# Patient Record
Sex: Female | Born: 1950 | Race: White | Hispanic: No | Marital: Married | Smoking: Never smoker
Health system: Southern US, Community
[De-identification: ages and names within clinical notes are randomized; demographics above are authoritative.]

## PROBLEM LIST (undated history)

## (undated) ENCOUNTER — Encounter
Attending: Student in an Organized Health Care Education/Training Program | Primary: Student in an Organized Health Care Education/Training Program

## (undated) ENCOUNTER — Telehealth

## (undated) ENCOUNTER — Encounter

## (undated) ENCOUNTER — Telehealth
Attending: Student in an Organized Health Care Education/Training Program | Primary: Student in an Organized Health Care Education/Training Program

## (undated) ENCOUNTER — Encounter: Attending: Pulmonary Disease | Primary: Pulmonary Disease

## (undated) ENCOUNTER — Ambulatory Visit
Payer: MEDICARE | Attending: Student in an Organized Health Care Education/Training Program | Primary: Student in an Organized Health Care Education/Training Program

## (undated) ENCOUNTER — Ambulatory Visit: Payer: MEDICARE

## (undated) ENCOUNTER — Ambulatory Visit

## (undated) ENCOUNTER — Inpatient Hospital Stay: Payer: PRIVATE HEALTH INSURANCE

## (undated) ENCOUNTER — Telehealth: Attending: Pulmonary Disease | Primary: Pulmonary Disease

## (undated) ENCOUNTER — Ambulatory Visit: Attending: Pharmacist | Primary: Pharmacist

## (undated) ENCOUNTER — Encounter: Attending: Internal Medicine | Primary: Internal Medicine

## (undated) ENCOUNTER — Ambulatory Visit: Payer: PRIVATE HEALTH INSURANCE

## (undated) DIAGNOSIS — I341 Nonrheumatic mitral (valve) prolapse: Secondary | ICD-10-CM

## (undated) DIAGNOSIS — C801 Malignant (primary) neoplasm, unspecified: Secondary | ICD-10-CM

## (undated) DIAGNOSIS — E039 Hypothyroidism, unspecified: Secondary | ICD-10-CM

## (undated) DIAGNOSIS — Z972 Presence of dental prosthetic device (complete) (partial): Secondary | ICD-10-CM

## (undated) DIAGNOSIS — G35 Multiple sclerosis: Secondary | ICD-10-CM

## (undated) DIAGNOSIS — R002 Palpitations: Secondary | ICD-10-CM

## (undated) DIAGNOSIS — G43909 Migraine, unspecified, not intractable, without status migrainosus: Secondary | ICD-10-CM

## (undated) HISTORY — PX: ABDOMINAL HYSTERECTOMY: SHX81

## (undated) HISTORY — PX: ABDOMINAL SURGERY: SHX537

## (undated) MED ORDER — ASCORBIC ACID (VITAMIN C) 250 MG TABLET: Freq: Every day | ORAL | 0.00000 days | Status: SS

## (undated) MED ORDER — CALCIUM 650 MG-VITAMIN D3 12.5 MCG-VITAMIN K 40 MCG CHEWABLE TABLET: Freq: Every day | ORAL | 0.00000 days | Status: SS

## (undated) MED ORDER — CHOLECALCIFEROL (VITAMIN D3) 50 MCG (2,000 UNIT) CAPSULE: Freq: Every day | ORAL | 0.00000 days | Status: SS

## (undated) MED ORDER — FLINTSTONES COMPLETE (IRON) CHEWABLE TABLET: Freq: Every day | ORAL | 0.00000 days | Status: SS

## (undated) MED ORDER — VITAMIN D2 ORAL
Freq: Every day | ORAL | 0.00000 days | Status: SS
Start: ? — End: 2020-02-25

## (undated) MED ORDER — CALCIUM CARBONATE 200 MG CALCIUM (500 MG) CHEWABLE TABLET: Freq: Two times a day (BID) | ORAL | 0.00000 days | Status: SS | PRN

---

## 2004-04-14 ENCOUNTER — Ambulatory Visit: Payer: Self-pay | Admitting: Internal Medicine

## 2004-08-29 ENCOUNTER — Ambulatory Visit: Payer: Self-pay | Admitting: Internal Medicine

## 2004-09-05 ENCOUNTER — Ambulatory Visit: Payer: Self-pay | Admitting: Internal Medicine

## 2004-11-18 ENCOUNTER — Ambulatory Visit: Payer: Self-pay | Admitting: Podiatry

## 2005-04-12 ENCOUNTER — Ambulatory Visit: Payer: Self-pay | Admitting: Internal Medicine

## 2005-04-19 ENCOUNTER — Ambulatory Visit: Payer: Self-pay | Admitting: Internal Medicine

## 2005-10-02 ENCOUNTER — Ambulatory Visit: Payer: Self-pay | Admitting: Internal Medicine

## 2005-11-13 ENCOUNTER — Ambulatory Visit: Payer: Self-pay | Admitting: Internal Medicine

## 2006-10-09 ENCOUNTER — Ambulatory Visit: Payer: Self-pay | Admitting: Internal Medicine

## 2007-10-10 ENCOUNTER — Ambulatory Visit: Payer: Self-pay | Admitting: Internal Medicine

## 2008-10-13 ENCOUNTER — Ambulatory Visit: Payer: Self-pay | Admitting: Internal Medicine

## 2009-10-30 ENCOUNTER — Ambulatory Visit: Payer: Self-pay | Admitting: Internal Medicine

## 2009-11-24 ENCOUNTER — Ambulatory Visit: Payer: Self-pay | Admitting: Internal Medicine

## 2011-01-15 ENCOUNTER — Ambulatory Visit: Payer: Self-pay

## 2011-01-17 ENCOUNTER — Ambulatory Visit: Payer: Self-pay | Admitting: Internal Medicine

## 2012-02-06 ENCOUNTER — Ambulatory Visit: Payer: Self-pay | Admitting: Nurse Practitioner

## 2013-02-11 ENCOUNTER — Ambulatory Visit: Payer: Self-pay | Admitting: Nurse Practitioner

## 2014-03-03 ENCOUNTER — Ambulatory Visit: Payer: Self-pay | Admitting: Nurse Practitioner

## 2014-12-08 ENCOUNTER — Other Ambulatory Visit: Payer: Self-pay | Admitting: Nurse Practitioner

## 2014-12-08 DIAGNOSIS — Z1231 Encounter for screening mammogram for malignant neoplasm of breast: Secondary | ICD-10-CM

## 2015-03-09 ENCOUNTER — Ambulatory Visit
Admission: RE | Admit: 2015-03-09 | Discharge: 2015-03-09 | Disposition: A | Discharge status: Home / Self Care | Payer: BLUE CROSS/BLUE SHIELD | Source: Ambulatory Visit | Attending: Nurse Practitioner | Admitting: Nurse Practitioner

## 2015-03-09 DIAGNOSIS — Z1231 Encounter for screening mammogram for malignant neoplasm of breast: Secondary | ICD-10-CM

## 2015-09-07 ENCOUNTER — Encounter: Payer: Self-pay | Admitting: Emergency Medicine

## 2015-09-07 ENCOUNTER — Emergency Department: Payer: BLUE CROSS/BLUE SHIELD

## 2015-09-07 ENCOUNTER — Emergency Department
Admission: EM | Admit: 2015-09-07 | Discharge: 2015-09-07 | Disposition: A | Discharge status: Home / Self Care | Payer: BLUE CROSS/BLUE SHIELD | Attending: Student | Admitting: Student

## 2015-09-07 DIAGNOSIS — R200 Anesthesia of skin: Secondary | ICD-10-CM | POA: Diagnosis not present

## 2015-09-07 DIAGNOSIS — Z79899 Other long term (current) drug therapy: Secondary | ICD-10-CM | POA: Diagnosis not present

## 2015-09-07 DIAGNOSIS — R51 Headache: Secondary | ICD-10-CM | POA: Insufficient documentation

## 2015-09-07 DIAGNOSIS — R202 Paresthesia of skin: Secondary | ICD-10-CM

## 2015-09-07 DIAGNOSIS — R519 Headache, unspecified: Secondary | ICD-10-CM

## 2015-09-07 HISTORY — DX: Multiple sclerosis: G35

## 2015-09-07 LAB — CBC
HCT: 39 % (ref 35.0–47.0)
Hemoglobin: 13.2 g/dL (ref 12.0–16.0)
MCH: 28.2 pg (ref 26.0–34.0)
MCHC: 33.9 g/dL (ref 32.0–36.0)
MCV: 83.1 fL (ref 80.0–100.0)
PLATELETS: 202 10*3/uL (ref 150–440)
RBC: 4.7 MIL/uL (ref 3.80–5.20)
RDW: 14 % (ref 11.5–14.5)
WBC: 6 10*3/uL (ref 3.6–11.0)

## 2015-09-07 LAB — URINALYSIS COMPLETE WITH MICROSCOPIC (ARMC ONLY)
BILIRUBIN URINE: NEGATIVE
Glucose, UA: NEGATIVE mg/dL
Hgb urine dipstick: NEGATIVE
KETONES UR: NEGATIVE mg/dL
LEUKOCYTES UA: NEGATIVE
Nitrite: NEGATIVE
PH: 7 (ref 5.0–8.0)
Protein, ur: NEGATIVE mg/dL
SPECIFIC GRAVITY, URINE: 1.004 — AB (ref 1.005–1.030)

## 2015-09-07 LAB — BASIC METABOLIC PANEL
Anion gap: 6 (ref 5–15)
BUN: 15 mg/dL (ref 6–20)
CALCIUM: 9.8 mg/dL (ref 8.9–10.3)
CHLORIDE: 100 mmol/L — AB (ref 101–111)
CO2: 29 mmol/L (ref 22–32)
CREATININE: 0.96 mg/dL (ref 0.44–1.00)
GFR calc non Af Amer: >60 mL/min (ref >60)
Glucose, Bld: 100 mg/dL — ABNORMAL HIGH (ref 65–99)
Potassium: 4 mmol/L (ref 3.5–5.1)
SODIUM: 135 mmol/L (ref 135–145)

## 2015-09-07 MED ORDER — LORAZEPAM 2 MG/ML IJ SOLN
0.5000 mg | Freq: Once | INTRAMUSCULAR | Status: AC
  Administered 2015-09-07: 0.5 mg via INTRAVENOUS
  Filled 2015-09-07: qty 1

## 2015-09-07 MED ORDER — GADOBENATE DIMEGLUMINE 529 MG/ML IV SOLN
10.0000 mL | Freq: Once | INTRAVENOUS | Status: AC | PRN
  Administered 2015-09-07: 10 mL via INTRAVENOUS

## 2015-09-07 NOTE — ED Notes (Signed)
Pt returned from MRI via stretcher in care of Judson Roch, EDT.   Pt ambulatory to toilet.

## 2015-09-07 NOTE — ED Notes (Signed)
Dr. Gayle at bedside  

## 2015-09-07 NOTE — ED Provider Notes (Signed)
[facility] Emergency Department Provider Note   ____________________________________________  Time seen: Approximately 6:15 PM  I have reviewed the triage vital signs and the nursing notes.   HISTORY  Chief Complaint Numbness    HPI Shannon Gilmore is a 65 y.o. female with history of hypothyroidism, headaches, multiple sclerosis who presents for evaluation of numbness in the left face, arm, leg today constant since 2 PM, she describes as moderate, no modifying factors. Patient reports that she has also had contraction-like pain in the right side of her head on and off for the past week. Currently she is not having any headache. She reports that she has not had numbness this severe or quickly progressing in the setting of MS previously though she has had some intermittent left-sided numbness before. No recent illness including no vomiting, diarrhea, fevers or chills. No chest or difficulty breathing. No associated weakness, vision change or word-finding difficulty.   Past Medical History  Diagnosis Date  . MS (multiple sclerosis) ([place])     There are no active problems to display for this patient.   Past Surgical History  Procedure Laterality Date  . Abdominal surgery    . Abdominal hysterectomy      Current Outpatient Rx  Name  Route  Sig  Dispense  Refill  . amitriptyline (ELAVIL) 10 MG tablet   Oral   Take 10 mg by mouth at bedtime.         . conjugated estrogens (PREMARIN) vaginal cream   Vaginal   Place 1 Applicatorful vaginally daily.         Marland Kitchen dexamethasone 0.5 MG/5ML elixir   Oral   Take 5 mLs by mouth daily.         Marland Kitchen levothyroxine (SYNTHROID, LEVOTHROID) 50 MCG tablet   Oral   Take 50 mcg by mouth daily before breakfast.         . propranolol ER (INDERAL LA) 80 MG 24 hr capsule   Oral   Take 80 mg by mouth daily.           Allergies Sulfa antibiotics  No family history on file.  Social History Social  History  Substance Use Topics  . Smoking status: Never Smoker   . Smokeless tobacco: None  . Alcohol Use: No    Review of Systems Constitutional: No fever/chills Eyes: No visual changes. ENT: No sore throat. Cardiovascular: Denies chest pain. Respiratory: Denies shortness of breath. Gastrointestinal: No abdominal pain.  No nausea, no vomiting.  No diarrhea.  No constipation. Genitourinary: Negative for dysuria. Musculoskeletal: Negative for back pain. Skin: Negative for rash. Neurological: Positive for headache, no focal weakness, + numbness.  10-point ROS otherwise negative.  ____________________________________________   PHYSICAL EXAM:  VITAL SIGNS: ED Triage Vitals  Enc Vitals Group     BP 09/07/15 1635 147/89 mmHg     Pulse Rate 09/07/15 1635 57     Resp 09/07/15 1635 18     Temp 09/07/15 1635 97.7 F (36.5 C)     Temp Source 09/07/15 1635 Oral     SpO2 09/07/15 1635 97 %     Weight 09/07/15 1635 126 lb (57.153 kg)     Height 09/07/15 1635 5\' 1"  (1.549 m)     Head Cir --      Peak Flow --      Pain Score --      Pain Loc --      Pain Edu? --  Excl. in [place]? --     Constitutional: Alert and oriented. Well appearing and in no acute distress. Eyes: Conjunctivae are normal. PERRL. EOMI. Head: Atraumatic. Nose: No congestion/rhinnorhea. Mouth/Throat: Mucous membranes are moist.  Oropharynx non-erythematous. Neck: No stridor.  Supple without meningismus. Cardiovascular: Normal rate, regular rhythm. Grossly normal heart sounds.  Good peripheral circulation. Respiratory: Normal respiratory effort.  No retractions. Lungs CTAB. Gastrointestinal: Soft and nontender. No distention. No CVA tenderness. Genitourinary: deferred Musculoskeletal: No lower extremity tenderness nor edema.  No joint effusions. Neurologic:  Normal speech and language. 5/5 strength in bilateral upper and lower extremities, mildly decreased sensation to light touch throughout the entire left  face, left arm, left leg, sensation intact to light touch in the right face arm and leg. Cranial nerves II through XII otherwise intact. Skin:  Skin is warm, dry and intact. No rash noted. Psychiatric: Mood and affect are normal. Speech and behavior are normal.  ____________________________________________   LABS (all labs ordered are listed, but only abnormal results are displayed)  Labs Reviewed  BASIC METABOLIC PANEL - Abnormal; Notable for the following:    Chloride 100 (*)    Glucose, Bld 100 (*)    All other components within normal limits  URINALYSIS COMPLETEWITH MICROSCOPIC ([facility] ONLY) - Abnormal; Notable for the following:    Color, Urine STRAW (*)    APPearance CLEAR (*)    Specific Gravity, Urine 1.004 (*)    Bacteria, UA RARE (*)    Squamous Epithelial / LPF 0-5 (*)    All other components within normal limits  CBC   ____________________________________________  EKG  ED ECG REPORT I, Joanne Gavel, the attending physician, personally viewed and interpreted this ECG.   Date: 09/07/2015  EKG Time: 16:49  Rate: 56  Rhythm: sinus bradycardia  Axis: normal  Intervals:none  ST&T Change: No acute ST elevation or acute ST depression.  ____________________________________________  RADIOLOGY  MRI Brain with and without Contrast IMPRESSION: 1. Unchanged distribution of scattered white matter lesions in a pattern that is compatible with the diagnosis of multiple sclerosis. 2. No new or active demyelinating lesions. 3. No acute intracranial abnormality ____________________________________________   PROCEDURES  Procedure(s) performed: None  Procedures  Critical Care performed: No  ____________________________________________   INITIAL IMPRESSION / ASSESSMENT AND PLAN / ED COURSE  Pertinent labs & imaging results that were available during my care of the patient were reviewed by me and considered in my medical decision making (see chart for  details).  Shannon Gilmore is a 65 y.o. female with history of hypothyroidism, headaches, multiple sclerosis who presents for evaluation of numbness in the left face, arm, leg today constant since 2 PM, she describes as moderate, no modifying factors. On exam, she is very well-appearing and in no acute distress her vital signs are stable, she is afebrile. She has the faintest decreased sensation to light touch throughout the left face arm and leg without any other associated neurological deficit. EKG is reassuring. CBC, BMP unremarkable. Urinalysis is not consistent with infection. Her deficits are mild however she reports that she's not had symptoms this severe previously with multiple sclerosis so we will obtain an MRI of the brain with and without contrast to evaluate for active white matter lesions. She is also complaining of this contraction-like pain in the right side of her scalp and I doubt that this represents subarachnoid hemorrhage, not consistent with meningitis.  ----------------------------------------- 9:51 PM on 09/07/2015 ----------------------------------------- Patient reports that her headache has improved at  this time without any intervention in the emergency department. The MRI of her brain is unchanged from prior, there is unchanged distribution of white matter lesions but no new or active demyelinating lesions, no other acute intracranial abnormality. I discussed this with her and she is relieved. I discussed that the exact cause of her paresthesia is not clear to me and she needs to follow with her primary care doctor within the next 1-2 days for reevaluation. Nonspecific headache has also resolved. We discussed return precautions, need for close PCP follow-up and neurology follow-up and she is comfortable with the discharge plan.  ____________________________________________   FINAL CLINICAL IMPRESSION(S) / ED DIAGNOSES  Final diagnoses:  Numbness  Paresthesia  Acute  nonintractable headache, unspecified headache type      NEW MEDICATIONS STARTED DURING THIS VISIT:  New Prescriptions   No medications on file     Note:  This document was prepared using Dragon voice recognition software and may include unintentional dictation errors.    Joanne Gavel, MD 09/07/15 2152

## 2015-09-07 NOTE — ED Notes (Addendum)
Pt states she was diagnosed with a "slowly progressing" form of MS in the late 80s and has never followed up on this diagnosis with a neurologist d/t her symptoms always being consistent and non worrisome to her, always "spotty numbness" on the left side of her head and body that is tolerated well.  Last week she states a new sensation started, described as a "drawing sensation", also equates it with "Braxton Hicks contractions" in the right side of her head.  In addition the numbness on the left side has been more consistent than in the past.

## 2015-09-07 NOTE — ED Notes (Signed)
Pt transported to MRI via stretcher.  

## 2015-09-07 NOTE — ED Notes (Signed)
Stallings, MRI tech, called and stated IV was not working. Starting another one to get MRI done.

## 2015-09-07 NOTE — ED Notes (Signed)
Pt ambulatory to toilet with no assistance.  

## 2015-09-07 NOTE — ED Notes (Signed)
Pt provided ginger ale to drink, ok per Dr. Edd Fabian.

## 2015-09-07 NOTE — ED Notes (Signed)
Pt with numbing sensation to top of right side scalp that ran all the way down the right side of her body. Pt with hx of MS. Denies  Any pain at this time.

## 2015-12-27 ENCOUNTER — Other Ambulatory Visit: Payer: Self-pay | Admitting: Nurse Practitioner

## 2015-12-27 DIAGNOSIS — Z1231 Encounter for screening mammogram for malignant neoplasm of breast: Secondary | ICD-10-CM

## 2016-03-14 ENCOUNTER — Ambulatory Visit
Admission: RE | Admit: 2016-03-14 | Discharge: 2016-03-14 | Disposition: A | Discharge status: Home / Self Care | Payer: BLUE CROSS/BLUE SHIELD | Source: Ambulatory Visit | Attending: Nurse Practitioner | Admitting: Nurse Practitioner

## 2016-03-14 DIAGNOSIS — Z1231 Encounter for screening mammogram for malignant neoplasm of breast: Secondary | ICD-10-CM | POA: Diagnosis present

## 2016-03-14 HISTORY — DX: Malignant (primary) neoplasm, unspecified: C80.1

## 2017-01-02 ENCOUNTER — Other Ambulatory Visit: Payer: Self-pay | Admitting: Nurse Practitioner

## 2017-01-02 DIAGNOSIS — Z1231 Encounter for screening mammogram for malignant neoplasm of breast: Secondary | ICD-10-CM

## 2017-03-20 ENCOUNTER — Ambulatory Visit
Admission: RE | Admit: 2017-03-20 | Discharge: 2017-03-20 | Disposition: A | Discharge status: Home / Self Care | Payer: Medicare Other | Source: Ambulatory Visit | Attending: Nurse Practitioner | Admitting: Nurse Practitioner

## 2017-03-20 ENCOUNTER — Inpatient Hospital Stay: Admission: RE | Admit: 2017-03-20 | Payer: BLUE CROSS/BLUE SHIELD | Source: Ambulatory Visit

## 2017-03-20 DIAGNOSIS — Z1231 Encounter for screening mammogram for malignant neoplasm of breast: Secondary | ICD-10-CM | POA: Insufficient documentation

## 2017-07-04 ENCOUNTER — Other Ambulatory Visit: Payer: Self-pay | Admitting: Nurse Practitioner

## 2017-07-04 DIAGNOSIS — R1310 Dysphagia, unspecified: Secondary | ICD-10-CM

## 2017-07-04 DIAGNOSIS — G35 Multiple sclerosis: Secondary | ICD-10-CM

## 2017-07-17 ENCOUNTER — Ambulatory Visit: Payer: Medicare Other

## 2017-07-19 ENCOUNTER — Other Ambulatory Visit: Payer: Self-pay | Admitting: Nurse Practitioner

## 2017-07-19 DIAGNOSIS — R1310 Dysphagia, unspecified: Secondary | ICD-10-CM

## 2017-07-19 DIAGNOSIS — G35 Multiple sclerosis: Secondary | ICD-10-CM

## 2017-07-24 ENCOUNTER — Ambulatory Visit
Admission: RE | Admit: 2017-07-24 | Discharge: 2017-07-24 | Disposition: A | Discharge status: Home / Self Care | Payer: Medicare Other | Source: Ambulatory Visit | Attending: Nurse Practitioner | Admitting: Nurse Practitioner

## 2017-07-24 DIAGNOSIS — Q387 Congenital pharyngeal pouch: Secondary | ICD-10-CM | POA: Insufficient documentation

## 2017-07-24 DIAGNOSIS — R1310 Dysphagia, unspecified: Secondary | ICD-10-CM | POA: Diagnosis present

## 2017-07-24 DIAGNOSIS — G35 Multiple sclerosis: Secondary | ICD-10-CM | POA: Insufficient documentation

## 2017-11-08 ENCOUNTER — Other Ambulatory Visit: Payer: Self-pay | Admitting: Podiatry

## 2017-11-08 ENCOUNTER — Encounter: Payer: Self-pay | Admitting: *Deleted

## 2017-11-08 ENCOUNTER — Other Ambulatory Visit: Payer: Self-pay

## 2017-11-08 NOTE — Discharge Instructions (Signed)
[facility] °[facility] ° °POST OPERATIVE INSTRUCTIONS FOR DR. TROXLER AND DR. FOWLER °[facility] ° ° °1. Take your medication as prescribed.  Pain medication should be taken only as needed. ° °2. Keep the dressing clean, dry and intact. ° °3. Keep your foot elevated above the heart level for the first 48 hours. ° °4. Walking to the bathroom and brief periods of walking are acceptable, unless we have instructed you to be non-weight bearing. ° °5. Always wear your post-op shoe when walking.  Always use your crutches if you are to be non-weight bearing. ° °6. Do not take a shower. Baths are permissible as long as the foot is kept out of the water.  ° °7. Every hour you are awake:  °- Bend your knee 15 times. °- Flex foot 15 times °- Massage calf 15 times ° °8. Call [facility] ([phone]) if any of the following problems occur: °- You develop a temperature or fever. °- The bandage becomes saturated with blood. °- Medication does not stop your pain. °- Injury of the foot occurs. °- Any symptoms of infection including redness, odor, or red streaks running from wound. ° ° °General Anesthesia, Adult, Care After °These instructions provide you with information about caring for yourself after your procedure. Your health care provider may also give you more specific instructions. Your treatment has been planned according to current medical practices, but problems sometimes occur. Call your health care provider if you have any problems or questions after your procedure. °What can I expect after the procedure? °After the procedure, it is common to have: °· Vomiting. °· A sore throat. °· Mental slowness. ° °It is common to feel: °· Nauseous. °· Cold or shivery. °· Sleepy. °· Tired. °· Sore or achy, even in parts of your body where you did not have surgery. ° °Follow these instructions at home: °For at least 24 hours after the procedure: °· Do not: °? Participate in  activities where you could fall or become injured. °? Drive. °? Use heavy machinery. °? Drink alcohol. °? Take sleeping pills or medicines that cause drowsiness. °? Make important decisions or sign legal documents. °? Take care of children on your own. °· Rest. °Eating and drinking °· If you vomit, drink water, juice, or soup when you can drink without vomiting. °· Drink enough fluid to keep your urine clear or pale yellow. °· Make sure you have little or no nausea before eating solid foods. °· Follow the diet recommended by your health care provider. °General instructions °· Have a responsible adult stay with you until you are awake and alert. °· Return to your normal activities as told by your health care provider. Ask your health care provider what activities are safe for you. °· Take over-the-counter and prescription medicines only as told by your health care provider. °· If you smoke, do not smoke without supervision. °· Keep all follow-up visits as told by your health care provider. This is important. °Contact a health care provider if: °· You continue to have nausea or vomiting at home, and medicines are not helpful. °· You cannot drink fluids or start eating again. °· You cannot urinate after 8-12 hours. °· You develop a skin rash. °· You have fever. °· You have increasing redness at the site of your procedure. °Get help right away if: °· You have difficulty breathing. °· You have chest pain. °· You have unexpected bleeding. °· You feel that you   are having a life-threatening or urgent problem. °This information is not intended to replace advice given to you by your health care provider. Make sure you discuss any questions you have with your health care provider. °Document Released: 05/15/2000 Document Revised: 07/12/2015 Document Reviewed: 01/21/2015 °Elsevier Interactive Patient Education © 2018 [employer] ° °

## 2017-11-15 ENCOUNTER — Ambulatory Visit
Admission: RE | Admit: 2017-11-15 | Discharge: 2017-11-15 | Disposition: A | Discharge status: Home / Self Care | Payer: Medicare Other | Source: Ambulatory Visit | Attending: Podiatry | Admitting: Podiatry

## 2017-11-15 ENCOUNTER — Ambulatory Visit: Payer: Medicare Other | Admitting: Anesthesiology

## 2017-11-15 ENCOUNTER — Encounter
Admission: RE | Disposition: A | Discharge status: Home / Self Care | Payer: Self-pay | Source: Ambulatory Visit | Attending: Podiatry

## 2017-11-15 DIAGNOSIS — Z7952 Long term (current) use of systemic steroids: Secondary | ICD-10-CM | POA: Diagnosis not present

## 2017-11-15 DIAGNOSIS — E039 Hypothyroidism, unspecified: Secondary | ICD-10-CM | POA: Insufficient documentation

## 2017-11-15 DIAGNOSIS — M2012 Hallux valgus (acquired), left foot: Secondary | ICD-10-CM | POA: Insufficient documentation

## 2017-11-15 DIAGNOSIS — G35 Multiple sclerosis: Secondary | ICD-10-CM | POA: Diagnosis not present

## 2017-11-15 DIAGNOSIS — Z79890 Hormone replacement therapy (postmenopausal): Secondary | ICD-10-CM | POA: Diagnosis not present

## 2017-11-15 DIAGNOSIS — Z7982 Long term (current) use of aspirin: Secondary | ICD-10-CM | POA: Diagnosis not present

## 2017-11-15 DIAGNOSIS — Z79899 Other long term (current) drug therapy: Secondary | ICD-10-CM | POA: Diagnosis not present

## 2017-11-15 HISTORY — DX: Hypothyroidism, unspecified: E03.9

## 2017-11-15 HISTORY — DX: Palpitations: R00.2

## 2017-11-15 HISTORY — PX: HALLUX VALGUS AUSTIN: SHX6623

## 2017-11-15 HISTORY — DX: Migraine, unspecified, not intractable, without status migrainosus: G43.909

## 2017-11-15 HISTORY — PX: AIKEN OSTEOTOMY: SHX6331

## 2017-11-15 HISTORY — DX: Presence of dental prosthetic device (complete) (partial): Z97.2

## 2017-11-15 HISTORY — DX: Nonrheumatic mitral (valve) prolapse: I34.1

## 2017-11-15 SURGERY — BUNIONECTOMY, AKIN
Anesthesia: General | Site: Foot | Laterality: Left

## 2017-11-15 MED ORDER — MIDAZOLAM HCL 5 MG/5ML IJ SOLN
INTRAMUSCULAR | Status: DC | PRN
  Administered 2017-11-15: 2 mg via INTRAVENOUS

## 2017-11-15 MED ORDER — BUPIVACAINE LIPOSOME 1.3 % IJ SUSP
INTRAMUSCULAR | Status: DC | PRN
  Administered 2017-11-15: 3.5 mL
  Administered 2017-11-15: 5 mL

## 2017-11-15 MED ORDER — FENTANYL CITRATE (PF) 100 MCG/2ML IJ SOLN
INTRAMUSCULAR | Status: DC | PRN
  Administered 2017-11-15: 25 ug via INTRAVENOUS

## 2017-11-15 MED ORDER — EPHEDRINE SULFATE 50 MG/ML IJ SOLN
INTRAMUSCULAR | Status: DC | PRN
  Administered 2017-11-15: 5 mg via INTRAVENOUS
  Administered 2017-11-15 (×4): 10 mg via INTRAVENOUS

## 2017-11-15 MED ORDER — FENTANYL CITRATE (PF) 100 MCG/2ML IJ SOLN: 25.0000 ug | INTRAMUSCULAR | Status: DC | PRN

## 2017-11-15 MED ORDER — PROMETHAZINE HCL 25 MG/ML IJ SOLN: 6.2500 mg | INTRAMUSCULAR | Status: DC | PRN

## 2017-11-15 MED ORDER — PHENYLEPHRINE HCL 10 MG/ML IJ SOLN
INTRAMUSCULAR | Status: DC | PRN
  Administered 2017-11-15 (×2): 50 ug via INTRAVENOUS
  Administered 2017-11-15: 100 ug via INTRAVENOUS

## 2017-11-15 MED ORDER — GLYCOPYRROLATE 0.2 MG/ML IJ SOLN
INTRAMUSCULAR | Status: DC | PRN
  Administered 2017-11-15 (×2): 0.1 mg via INTRAVENOUS

## 2017-11-15 MED ORDER — OXYCODONE HCL 5 MG/5ML PO SOLN: 5.0000 mg | Freq: Once | ORAL | Status: DC | PRN

## 2017-11-15 MED ORDER — OXYCODONE HCL 5 MG PO TABS: 5.0000 mg | ORAL_TABLET | Freq: Once | ORAL | Status: DC | PRN

## 2017-11-15 MED ORDER — ONDANSETRON HCL 4 MG/2ML IJ SOLN
INTRAMUSCULAR | Status: DC | PRN
  Administered 2017-11-15: 4 mg via INTRAVENOUS

## 2017-11-15 MED ORDER — CEFAZOLIN SODIUM-DEXTROSE 2-4 GM/100ML-% IV SOLN
2.0000 g | INTRAVENOUS | Status: AC
  Administered 2017-11-15: 2 g via INTRAVENOUS

## 2017-11-15 MED ORDER — BUPIVACAINE HCL 0.25 % IJ SOLN
INTRAMUSCULAR | Status: DC | PRN
  Administered 2017-11-15: 3.5 mL
  Administered 2017-11-15: 5 mL

## 2017-11-15 MED ORDER — POVIDONE-IODINE 7.5 % EX SOLN: Freq: Once | CUTANEOUS | Status: DC

## 2017-11-15 MED ORDER — LACTATED RINGERS IV SOLN
INTRAVENOUS | Status: DC | PRN
  Administered 2017-11-15 (×2): via INTRAVENOUS

## 2017-11-15 MED ORDER — PROPOFOL 10 MG/ML IV BOLUS
INTRAVENOUS | Status: DC | PRN
  Administered 2017-11-15: 50 mg via INTRAVENOUS
  Administered 2017-11-15: 150 mg via INTRAVENOUS

## 2017-11-15 MED ORDER — OXYCODONE-ACETAMINOPHEN 7.5-325 MG PO TABS: 1.0000 | ORAL_TABLET | ORAL | 0 refills | Status: AC | PRN

## 2017-11-15 MED ORDER — LIDOCAINE HCL (CARDIAC) PF 100 MG/5ML IV SOSY
PREFILLED_SYRINGE | INTRAVENOUS | Status: DC | PRN
  Administered 2017-11-15: 30 mg via INTRATRACHEAL

## 2017-11-15 MED ORDER — DEXAMETHASONE SODIUM PHOSPHATE 4 MG/ML IJ SOLN
INTRAMUSCULAR | Status: DC | PRN
  Administered 2017-11-15: 4 mg via INTRAVENOUS

## 2017-11-15 SURGICAL SUPPLY — 43 items
2.5 countersink ×3 IMPLANT
BANDAGE ELASTIC 4 LF NS (GAUZE/BANDAGES/DRESSINGS) ×3 IMPLANT
BENZOIN TINCTURE PRP APPL 2/3 (GAUZE/BANDAGES/DRESSINGS) ×3 IMPLANT
BIT DRILL 1.7 LNG CANN (DRILL) ×3 IMPLANT
BLADE OSC/SAGITTAL MD 5.5X18 (BLADE) ×3 IMPLANT
BLADE OSC/SAGITTAL MD 9X18.5 (BLADE) ×3 IMPLANT
BNDG ESMARK 4X12 TAN STRL LF (GAUZE/BANDAGES/DRESSINGS) ×3 IMPLANT
BNDG GAUZE 4.5X4.1 6PLY STRL (MISCELLANEOUS) ×3 IMPLANT
BNDG STRETCH 4X75 STRL LF (GAUZE/BANDAGES/DRESSINGS) ×3 IMPLANT
CANISTER SUCT 1200ML W/VALVE (MISCELLANEOUS) ×3 IMPLANT
CLOSURE WOUND 1/4X4 (GAUZE/BANDAGES/DRESSINGS) ×1
COVER LIGHT HANDLE UNIVERSAL (MISCELLANEOUS) ×6 IMPLANT
CUFF TOURN SGL QUICK 18 (TOURNIQUET CUFF) ×3 IMPLANT
DRAPE FLUOR MINI C-ARM 54X84 (DRAPES) ×3 IMPLANT
DURAPREP 26ML APPLICATOR (WOUND CARE) ×3 IMPLANT
ELECT REM PT RETURN 9FT ADLT (ELECTROSURGICAL) ×3
ELECTRODE REM PT RTRN 9FT ADLT (ELECTROSURGICAL) ×1 IMPLANT
GAUZE PETRO XEROFOAM 1X8 (MISCELLANEOUS) ×3 IMPLANT
GAUZE SPONGE 4X4 12PLY STRL (GAUZE/BANDAGES/DRESSINGS) ×3 IMPLANT
GLOVE BIO SURGEON STRL SZ8 (GLOVE) ×3 IMPLANT
GOWN STRL REUS W/ TWL LRG LVL3 (GOWN DISPOSABLE) ×1 IMPLANT
GOWN STRL REUS W/ TWL XL LVL3 (GOWN DISPOSABLE) ×1 IMPLANT
GOWN STRL REUS W/TWL LRG LVL3 (GOWN DISPOSABLE) ×2
GOWN STRL REUS W/TWL XL LVL3 (GOWN DISPOSABLE) ×2
K-WIRE DBL END TROCAR 6X.045 (WIRE) ×3
KIT TURNOVER KIT A (KITS) ×3 IMPLANT
KWIRE DBL END TROCAR 6X.045 (WIRE) ×1 IMPLANT
NEEDLE HYPO 18GX1.5 BLUNT FILL (NEEDLE) ×3 IMPLANT
NEEDLE HYPO 25GX1X1/2 BEV (NEEDLE) ×3 IMPLANT
NS IRRIG 500ML POUR BTL (IV SOLUTION) ×3 IMPLANT
PACK EXTREMITY ARMC (MISCELLANEOUS) ×3 IMPLANT
PENCIL SMOKE EVACUATOR (MISCELLANEOUS) ×3 IMPLANT
RASP SM TEAR CROSS CUT (RASP) ×3 IMPLANT
SCREW HEADLESS 2.5X28 ×3 IMPLANT
STAPLE BONE 9X9X9 (Staple) ×3 IMPLANT
STOCKINETTE STRL 6IN 960660 (GAUZE/BANDAGES/DRESSINGS) ×3 IMPLANT
STRAP BODY AND KNEE 60X3 (MISCELLANEOUS) ×3 IMPLANT
STRIP CLOSURE SKIN 1/4X4 (GAUZE/BANDAGES/DRESSINGS) ×2 IMPLANT
SUT VIC AB 3-0 SH 27 (SUTURE) ×2
SUT VIC AB 3-0 SH 27X BRD (SUTURE) ×1 IMPLANT
SUT VIC AB 4-0 FS2 27 (SUTURE) ×3 IMPLANT
SYR 10ML LL (SYRINGE) IMPLANT
WIRE SMOOTH TROCAR .9MMX150MML (WIRE) ×6 IMPLANT

## 2017-11-15 NOTE — Anesthesia Procedure Notes (Signed)
Procedure Name: LMA Insertion Date/Time: 11/15/2017 9:18 AM Performed by: Cameron Ali, CRNA Pre-anesthesia Checklist: Patient identified, Emergency Drugs available, Suction available, Timeout performed and Patient being monitored Patient Re-evaluated:Patient Re-evaluated prior to induction Oxygen Delivery Method: Circle system utilized Preoxygenation: Pre-oxygenation with 100% oxygen Induction Type: IV induction LMA: LMA inserted LMA Size: 4.0 Number of attempts: 1 Placement Confirmation: positive ETCO2 and breath sounds checked- equal and bilateral Tube secured with: Tape Dental Injury: Teeth and Oropharynx as per pre-operative assessment

## 2017-11-15 NOTE — Op Note (Signed)
Operative note   Surgeon: Dr. Albertine Patricia, DPM.    Assistant: None    Preop diagnosis: Hallux abductovalgus left foot    Postop diagnosis: Same    Procedure:   1.  Hallux abductovalgus correction with double osteotomy including an Liane Comber type procedure with K wire fixation and a proximal phalanx osteotomy with staple fixation           EBL: 5 cc    Anesthesia:general delivered by anesthesia team.  I injected preoperatively 10 cc of Exparel mixed with 0.25% Marcaine plain 50-50 around the operative site.  Postoperatively I injected 6 cc of the same preparation.    Hemostasis: Ankle tourniquet 225 mils marked pressure for 58 minutes    Specimen: None    Complications: None    Operative indications: Chronic pain unresponsive to conservative care    Procedure:  Patient was brought into the OR and placed on the operating table in thesupine position. After anesthesia was obtained theleft lower extremity was prepped and draped in usual sterile fashion.  Operative Report: This time to direct to the first metatarsophalangeal joint of the left foot.  A 5 semilunar incision was made over this joint dorsally and deepened sharp blunt dissection.  Bleeders were clamped bovied as required.  Tissue was identified incised longitudinally and freed away from the dorsal medial plantar aspect first metatarsal head.  Large eminence of bone was noted on the region is resected and rasped smoothly.  There was an area of articular cartilage defect in the plantar central portion of the metatarsal head with a flap of loose cartilage I remove that small flap and then micro-fractured the subchondral bone with a tip of a K wire.  This point a 0.5 care was used for an apical axis guide to make an osteotomy From medial lateral and the metatarsal head neck region.  Once this cut was made care was removed and attention was then directed lateral aspect joint abductor tendon release fibular sesamoidal ligament release  were performed.  The head of metatarsals and transposed more lateral position fixated with a wire from the 2.5 screw set of the mini monster headless screws.  There is checked for single position and correction were noted.  At this point a 28 mm screw was placed across the osteotomy and was seen to take down to fixate the area nicely.  There is checked FluoroScan good position alignment to the first metatarsal head was noted.  The remaining medial shelf then resected and rasped smoothly.  K wire was removed and the screw was left intact.  This time to stick directed to the proximal phalanx which had earlier been dissected with friends periosteal tissue medial lateral and dorsally.  A wedge osteotomy was then performed with apex lateral base medial.  Wedge of bone was removed the area was feathered and the osteotomy was stabilized with a bone staple.  There is checked for single position correction fixation were noted to both there is good alignment of the joint was noted.  At this point there was copiously irrigated and a medial capsulorrhaphy was performed and closed with 3-0 Vicryl simple interrupted sutures.  Capsule tissue was closed with 3-0 Vicryl simple rib sutures and a 4 oh continuous stitch.  Deep superficial fascial layers were closed with a continuous 4-0 Vicryl stitch.  Skin was closed with 4-0 Vicryl subcuticular stitch.  A sterile compressive dressing was placed across wound this time consisting of Steri-Strips Xeroform gauze 4 x 4's Kling and Kerlix.  Tourniquet was released prior to complete vascular seen return to all digits of the left foot.  Posterior splint was placed on the left foot leg in the operating room.    Patient tolerated the procedure and anesthesia well.  Was transported from the OR to the PACU with all vital signs stable and vascular status intact. To be discharged per routine protocol.  Will follow up in approximately 1 week in the outpatient clinic.

## 2017-11-15 NOTE — Anesthesia Postprocedure Evaluation (Signed)
Anesthesia Post Note  Patient: Shannon Gilmore  Procedure(s) Performed: Barbie Banner OSTEOTOMY (Left Foot) HALLUX VALGUS AUSTIN (Left Foot)  Patient location during evaluation: PACU Anesthesia Type: General Level of consciousness: awake and alert Pain management: pain level controlled Vital Signs Assessment: post-procedure vital signs reviewed and stable Respiratory status: spontaneous breathing, nonlabored ventilation, respiratory function stable and patient connected to nasal cannula oxygen Cardiovascular status: blood pressure returned to baseline and stable Postop Assessment: no apparent nausea or vomiting Anesthetic complications: no    Johnda Billiot C

## 2017-11-15 NOTE — Anesthesia Preprocedure Evaluation (Signed)
Anesthesia Evaluation  Patient identified by MRN, date of birth, ID band Patient awake    Reviewed: Allergy & Precautions, NPO status , Patient's Chart, lab work & pertinent test results  Airway Mallampati: II  TM Distance: >3 FB Neck ROM: Full    Dental no notable dental hx.    Pulmonary neg pulmonary ROS,    Pulmonary exam normal breath sounds clear to auscultation       Cardiovascular Normal cardiovascular exam Rhythm:Regular Rate:Normal  MVP with not symptoms. Mets 4+   Neuro/Psych Multiple sclerosis negative psych ROS   GI/Hepatic negative GI ROS, Neg liver ROS,   Endo/Other  Hypothyroidism   Renal/GU negative Renal ROS  negative genitourinary   Musculoskeletal negative musculoskeletal ROS (+)   Abdominal   Peds negative pediatric ROS (+)  Hematology negative hematology ROS (+)   Anesthesia Other Findings   Reproductive/Obstetrics negative OB ROS                             Anesthesia Physical Anesthesia Plan  ASA: II  Anesthesia Plan: General   Post-op Pain Management:    Induction: Intravenous  PONV Risk Score and Plan:   Airway Management Planned: LMA  Additional Equipment:   Intra-op Plan:   Post-operative Plan: Extubation in OR  Informed Consent: I have reviewed the patients History and Physical, chart, labs and discussed the procedure including the risks, benefits and alternatives for the proposed anesthesia with the patient or authorized representative who has indicated his/her understanding and acceptance.   Dental advisory given  Plan Discussed with: CRNA  Anesthesia Plan Comments:         Anesthesia Quick Evaluation

## 2017-11-15 NOTE — H&P (Signed)
H and P has been reviewed and no changes are noted.  

## 2017-11-15 NOTE — Transfer of Care (Signed)
Immediate Anesthesia Transfer of Care Note  Patient: Shannon Gilmore  Procedure(s) Performed: Barbie Banner OSTEOTOMY (Left Foot) HALLUX VALGUS AUSTIN (Left Foot)  Patient Location: PACU  Anesthesia Type: General  Level of Consciousness: awake, alert  and patient cooperative  Airway and Oxygen Therapy: Patient Spontanous Breathing and Patient connected to supplemental oxygen  Post-op Assessment: Post-op Vital signs reviewed, Patient's Cardiovascular Status Stable, Respiratory Function Stable, Patent Airway and No signs of Nausea or vomiting  Post-op Vital Signs: Reviewed and stable  Complications: No apparent anesthesia complications

## 2017-11-16 ENCOUNTER — Encounter: Payer: Self-pay | Admitting: Podiatry

## 2018-01-03 ENCOUNTER — Other Ambulatory Visit: Payer: Self-pay | Admitting: Nurse Practitioner

## 2018-01-03 DIAGNOSIS — Z1231 Encounter for screening mammogram for malignant neoplasm of breast: Secondary | ICD-10-CM

## 2018-03-27 ENCOUNTER — Ambulatory Visit
Admission: RE | Admit: 2018-03-27 | Discharge: 2018-03-27 | Disposition: A | Discharge status: Home / Self Care | Payer: Medicare Other | Source: Ambulatory Visit | Attending: Nurse Practitioner | Admitting: Nurse Practitioner

## 2018-03-27 DIAGNOSIS — Z1231 Encounter for screening mammogram for malignant neoplasm of breast: Secondary | ICD-10-CM | POA: Insufficient documentation

## 2018-07-31 ENCOUNTER — Other Ambulatory Visit: Payer: Self-pay | Admitting: Nurse Practitioner

## 2018-07-31 DIAGNOSIS — R3129 Other microscopic hematuria: Secondary | ICD-10-CM

## 2018-07-31 DIAGNOSIS — M545 Low back pain, unspecified: Secondary | ICD-10-CM

## 2018-07-31 DIAGNOSIS — R399 Unspecified symptoms and signs involving the genitourinary system: Secondary | ICD-10-CM

## 2018-08-02 ENCOUNTER — Other Ambulatory Visit: Payer: Self-pay

## 2018-08-02 ENCOUNTER — Ambulatory Visit
Admission: RE | Admit: 2018-08-02 | Discharge: 2018-08-02 | Disposition: A | Discharge status: Home / Self Care | Payer: Medicare Other | Source: Ambulatory Visit | Attending: Nurse Practitioner | Admitting: Nurse Practitioner

## 2018-08-02 DIAGNOSIS — M545 Low back pain, unspecified: Secondary | ICD-10-CM

## 2018-08-02 DIAGNOSIS — R399 Unspecified symptoms and signs involving the genitourinary system: Secondary | ICD-10-CM | POA: Diagnosis not present

## 2018-08-02 DIAGNOSIS — R3129 Other microscopic hematuria: Secondary | ICD-10-CM | POA: Insufficient documentation

## 2019-01-27 ENCOUNTER — Other Ambulatory Visit: Payer: Self-pay | Admitting: Nurse Practitioner

## 2019-01-27 ENCOUNTER — Other Ambulatory Visit (HOSPITAL_COMMUNITY): Payer: Self-pay | Admitting: Nurse Practitioner

## 2019-01-27 DIAGNOSIS — R053 Chronic cough: Secondary | ICD-10-CM

## 2019-01-27 DIAGNOSIS — Z7722 Contact with and (suspected) exposure to environmental tobacco smoke (acute) (chronic): Secondary | ICD-10-CM

## 2019-01-27 DIAGNOSIS — R05 Cough: Secondary | ICD-10-CM

## 2019-01-27 DIAGNOSIS — R918 Other nonspecific abnormal finding of lung field: Secondary | ICD-10-CM

## 2019-01-27 DIAGNOSIS — Z1231 Encounter for screening mammogram for malignant neoplasm of breast: Secondary | ICD-10-CM

## 2019-01-31 ENCOUNTER — Ambulatory Visit
Admission: RE | Admit: 2019-01-31 | Discharge: 2019-01-31 | Disposition: A | Discharge status: Home / Self Care | Payer: Medicare Other | Source: Ambulatory Visit | Attending: Nurse Practitioner | Admitting: Nurse Practitioner

## 2019-01-31 ENCOUNTER — Other Ambulatory Visit: Payer: Self-pay

## 2019-01-31 DIAGNOSIS — R05 Cough: Secondary | ICD-10-CM | POA: Insufficient documentation

## 2019-01-31 DIAGNOSIS — R918 Other nonspecific abnormal finding of lung field: Secondary | ICD-10-CM

## 2019-01-31 DIAGNOSIS — R053 Chronic cough: Secondary | ICD-10-CM

## 2019-01-31 DIAGNOSIS — Z7722 Contact with and (suspected) exposure to environmental tobacco smoke (acute) (chronic): Secondary | ICD-10-CM | POA: Diagnosis present

## 2019-02-03 ENCOUNTER — Ambulatory Visit: Admit: 2019-02-03 | Discharge: 2019-02-04 | Payer: MEDICARE

## 2019-02-03 DIAGNOSIS — R9389 Abnormal findings on diagnostic imaging of other specified body structures: Principal | ICD-10-CM

## 2019-02-11 DIAGNOSIS — J441 Chronic obstructive pulmonary disease with (acute) exacerbation: Principal | ICD-10-CM

## 2019-02-12 ENCOUNTER — Ambulatory Visit: Admit: 2019-02-12 | Discharge: 2019-02-13 | Payer: MEDICARE

## 2019-02-12 ENCOUNTER — Ambulatory Visit: Admit: 2019-02-12 | Discharge: 2019-02-13 | Payer: MEDICARE | Attending: Internal Medicine | Primary: Internal Medicine

## 2019-02-12 DIAGNOSIS — Z114 Encounter for screening for human immunodeficiency virus [HIV]: Principal | ICD-10-CM

## 2019-02-12 DIAGNOSIS — R942 Abnormal results of pulmonary function studies: Principal | ICD-10-CM

## 2019-02-12 DIAGNOSIS — R05 Cough: Principal | ICD-10-CM

## 2019-02-12 DIAGNOSIS — R918 Other nonspecific abnormal finding of lung field: Principal | ICD-10-CM

## 2019-02-12 DIAGNOSIS — J471 Bronchiectasis with (acute) exacerbation: Principal | ICD-10-CM

## 2019-02-12 MED ORDER — AMOXICILLIN 875 MG-POTASSIUM CLAVULANATE 125 MG TABLET
ORAL_TABLET | Freq: Two times a day (BID) | ORAL | 0 refills | 14 days | Status: CP
Start: 2019-02-12 — End: 2019-02-26

## 2019-04-03 ENCOUNTER — Ambulatory Visit
Admission: RE | Admit: 2019-04-03 | Discharge: 2019-04-03 | Disposition: A | Discharge status: Home / Self Care | Payer: Medicare Other | Source: Ambulatory Visit | Attending: Nurse Practitioner | Admitting: Nurse Practitioner

## 2019-04-03 ENCOUNTER — Other Ambulatory Visit: Payer: Self-pay

## 2019-04-03 DIAGNOSIS — Z1231 Encounter for screening mammogram for malignant neoplasm of breast: Secondary | ICD-10-CM | POA: Insufficient documentation

## 2019-05-01 ENCOUNTER — Ambulatory Visit: Admit: 2019-05-01 | Discharge: 2019-05-02 | Payer: MEDICARE | Attending: Internal Medicine | Primary: Internal Medicine

## 2019-05-01 ENCOUNTER — Ambulatory Visit: Admit: 2019-05-01 | Discharge: 2019-05-02 | Payer: MEDICARE

## 2019-05-01 DIAGNOSIS — J479 Bronchiectasis, uncomplicated: Principal | ICD-10-CM

## 2019-05-01 MED ORDER — AMOXICILLIN 875 MG-POTASSIUM CLAVULANATE 125 MG TABLET
ORAL_TABLET | Freq: Two times a day (BID) | ORAL | 0 refills | 14 days | Status: CP
Start: 2019-05-01 — End: 2019-05-15

## 2019-07-10 ENCOUNTER — Ambulatory Visit: Admit: 2019-07-10 | Discharge: 2019-07-11 | Payer: MEDICARE | Attending: Internal Medicine | Primary: Internal Medicine

## 2019-07-10 DIAGNOSIS — J479 Bronchiectasis, uncomplicated: Principal | ICD-10-CM

## 2019-07-10 MED ORDER — ALBUTEROL SULFATE CONCENTRATE 2.5 MG/0.5 ML SOLUTION FOR NEBULIZATION
Freq: Two times a day (BID) | RESPIRATORY_TRACT | 11 refills | 30.00000 days | Status: CP
Start: 2019-07-10 — End: 2019-08-09

## 2019-07-10 MED ORDER — SODIUM CHLORIDE 7 % FOR NEBULIZATION
Freq: Two times a day (BID) | RESPIRATORY_TRACT | 11 refills | 30 days | Status: CP
Start: 2019-07-10 — End: 2019-08-09

## 2019-09-17 ENCOUNTER — Ambulatory Visit
Admit: 2019-09-17 | Discharge: 2019-09-17 | Payer: MEDICARE | Attending: Student in an Organized Health Care Education/Training Program | Primary: Student in an Organized Health Care Education/Training Program

## 2019-09-17 ENCOUNTER — Ambulatory Visit: Admit: 2019-09-17 | Discharge: 2019-09-17 | Payer: MEDICARE

## 2019-09-17 DIAGNOSIS — J479 Bronchiectasis, uncomplicated: Principal | ICD-10-CM

## 2019-09-17 MED ORDER — SODIUM CHLORIDE 3 % FOR NEBULIZATION
Freq: Two times a day (BID) | RESPIRATORY_TRACT | 12 refills | 30.00000 days | Status: CP
Start: 2019-09-17 — End: ?

## 2020-01-05 ENCOUNTER — Ambulatory Visit: Admit: 2020-01-05 | Discharge: 2020-01-06 | Payer: MEDICARE

## 2020-01-05 ENCOUNTER — Ambulatory Visit
Admit: 2020-01-05 | Discharge: 2020-01-06 | Payer: MEDICARE | Attending: Student in an Organized Health Care Education/Training Program | Primary: Student in an Organized Health Care Education/Training Program

## 2020-01-05 DIAGNOSIS — E039 Hypothyroidism, unspecified: Principal | ICD-10-CM

## 2020-01-05 DIAGNOSIS — J479 Bronchiectasis, uncomplicated: Principal | ICD-10-CM

## 2020-01-05 DIAGNOSIS — Z882 Allergy status to sulfonamides status: Principal | ICD-10-CM

## 2020-01-05 DIAGNOSIS — Z79899 Other long term (current) drug therapy: Principal | ICD-10-CM

## 2020-01-05 DIAGNOSIS — Z87891 Personal history of nicotine dependence: Principal | ICD-10-CM

## 2020-01-05 DIAGNOSIS — I341 Nonrheumatic mitral (valve) prolapse: Principal | ICD-10-CM

## 2020-01-05 DIAGNOSIS — Z7722 Contact with and (suspected) exposure to environmental tobacco smoke (acute) (chronic): Principal | ICD-10-CM

## 2020-01-05 DIAGNOSIS — G35 Multiple sclerosis: Principal | ICD-10-CM

## 2020-01-05 DIAGNOSIS — Z79890 Hormone replacement therapy (postmenopausal): Principal | ICD-10-CM

## 2020-01-05 DIAGNOSIS — J219 Acute bronchiolitis, unspecified: Principal | ICD-10-CM

## 2020-01-05 DIAGNOSIS — R918 Other nonspecific abnormal finding of lung field: Principal | ICD-10-CM

## 2020-01-05 DIAGNOSIS — K219 Gastro-esophageal reflux disease without esophagitis: Principal | ICD-10-CM

## 2020-01-09 NOTE — Unmapped (Signed)
Called pt to schedule covid test for bronch on 11/29. Pt will have test done at a local [facility] on 11/27 and will bring the results.

## 2020-01-19 ENCOUNTER — Ambulatory Visit: Admit: 2020-01-19 | Discharge: 2020-01-19 | Payer: MEDICARE

## 2020-01-19 DIAGNOSIS — Z7722 Contact with and (suspected) exposure to environmental tobacco smoke (acute) (chronic): Principal | ICD-10-CM

## 2020-01-19 DIAGNOSIS — Z79899 Other long term (current) drug therapy: Principal | ICD-10-CM

## 2020-01-19 DIAGNOSIS — Z882 Allergy status to sulfonamides status: Principal | ICD-10-CM

## 2020-01-19 DIAGNOSIS — E039 Hypothyroidism, unspecified: Principal | ICD-10-CM

## 2020-01-19 DIAGNOSIS — Z79890 Hormone replacement therapy (postmenopausal): Principal | ICD-10-CM

## 2020-01-19 DIAGNOSIS — K219 Gastro-esophageal reflux disease without esophagitis: Principal | ICD-10-CM

## 2020-01-19 DIAGNOSIS — J479 Bronchiectasis, uncomplicated: Principal | ICD-10-CM

## 2020-01-19 DIAGNOSIS — I341 Nonrheumatic mitral (valve) prolapse: Principal | ICD-10-CM

## 2020-01-19 DIAGNOSIS — R918 Other nonspecific abnormal finding of lung field: Principal | ICD-10-CM

## 2020-01-22 DIAGNOSIS — J471 Bronchiectasis with (acute) exacerbation: Principal | ICD-10-CM

## 2020-01-22 MED ORDER — TOBRAMYCIN 300 MG/5 ML IN 0.225 % SODIUM CHLORIDE FOR NEBULIZATION
Freq: Two times a day (BID) | RESPIRATORY_TRACT | 0 refills | 28.00000 days | Status: SS
Start: 2020-01-22 — End: 2020-02-25
  Filled 2020-01-27: qty 280, 28d supply, fill #0

## 2020-01-22 MED ORDER — LEVOFLOXACIN 750 MG TABLET
ORAL_TABLET | Freq: Every day | ORAL | 0 refills | 14.00000 days | Status: CP
Start: 2020-01-22 — End: 2020-02-05
  Filled 2020-01-27: qty 14, 14d supply, fill #0

## 2020-01-23 DIAGNOSIS — J471 Bronchiectasis with (acute) exacerbation: Principal | ICD-10-CM

## 2020-01-26 NOTE — Unmapped (Signed)
[facility] Specialty Medication Onboarding    Specialty Medication: tobramycin ([place])   Prior Authorization: Not Required   Financial Assistance: No - patient doesn't qualify for additional assistance   Final Copay/Day Supply: $443.54 / 28    Insurance Restrictions: None     Notes to Pharmacist: None    The triage team has completed the benefits investigation and has determined that the patient is able to fill this medication at [facility]. Please contact the patient to complete the onboarding or follow up with the prescribing physician as needed.

## 2020-01-27 MED ORDER — NEBULIZERS
0 refills | 0 days
Start: 2020-01-27 — End: ?

## 2020-01-27 MED FILL — LEVOFLOXACIN 750 MG TABLET: 14 days supply | Qty: 14 | Fill #0 | Status: AC

## 2020-01-27 MED FILL — LC PLUS MISC: 28 days supply | Qty: 1 | Fill #0

## 2020-01-27 MED FILL — LC PLUS MISC: 28 days supply | Qty: 1 | Fill #0 | Status: AC

## 2020-01-27 MED FILL — TOBRAMYCIN 300 MG/5 ML IN 0.225 % SODIUM CHLORIDE FOR NEBULIZATION: 28 days supply | Qty: 280 | Fill #0 | Status: AC

## 2020-01-27 NOTE — Unmapped (Signed)
[facility]   Patient Onboarding/Medication Counseling    This patient has been disenrolled from the [facility] specialty pharmacy services due to therapy is for1 month only, no refills.    Victoria Copeland  [facility] Specialty Pharmacist    Ms.Conry is a 69 y.o. female with bronchiectasis who I am counseling today on initiation of therapy.  I am speaking to the patient.    Was a Nurse, learning disability used for this call? No    Verified patient's date of birth / HIPAA.    Specialty medication(s) to be sent: CF/Pulmonary: -Generic inhaled tobramycin 300mg /53mL inhalation solution      Non-specialty medications/supplies to be sent: levaquin, Neb Cup       Medications not needed at this time: N/A         Tobi (tobramycin)    Medication & Administration     Dosage: Inhale 1 ampule (300mg ) via nebulizer every 12 hours for 28 days only.     Administration:   ??? Inhale contents of ampule sitting or standing upright and breathing normally through the mouthpiece of the nebulizer until there is no longer any mist being produced.    ??? Usually last medication taken when on several inhaled therapies.    Adherence/Missed dose instructions: If you miss a dose of tobramycin Inhalation and it is 6 hours or less from the time you usually take your dose, then take your dose as soon as you can, then resume your next dose at the usual time. Otherwise skip the dose, and resume at your next scheduled dose.    Goals of Therapy     To treat or control bacterial infection in lungs    Side Effects & Monitoring Parameters     ??? Voice alterations, loss of voice  ??? Throat irritation  ??? Bronchospasm     The following side effects should be reported to the provider:  ??? Tinnitus (ringing of the ears) or hearing loss    Contraindications, Warnings, & Precautions     ??? Ototoxicity    Drug/Food Interactions     ??? Medication list reviewed in Epic. The patient was instructed to inform the care team before taking any new medications or supplements. No drug interactions identified.      Storage, Handling Precautions, & Disposal     ??? Store in the refrigerator.  ??? May be stored at room temperature for up to 28 days.        Current Medications (including OTC/herbals), Comorbidities and Allergies     Current Outpatient Medications   Medication Sig Dispense Refill   ??? albuterol 2.5 mg/0.5 mL nebulizer solution Inhale 0.5 mL (2.5 mg total) by nebulization Two (2) times a day. 60 each 11   ??? amitriptyline (ELAVIL) 10 MG tablet Take 10 mg by mouth.     ??? budesonide (PULMICORT) 0.5 mg/2 mL nebulizer solution Pt states it's a nasal/ sinus saline rinse- not nebulizer     ??? calcium carbonate-vitamin D3 600 mg(1,500mg ) -400 unit per tablet Take 1 tablet by mouth.     ??? conjugated estrogens (PREMARIN) 0.625 mg/gram vaginal cream Insert 0.5 g into the vagina.     ??? dexAMETHasone 0.5 mg/5 mL elixir Take 5 mL by mouth.     ??? fluoride, sodium, 1.1 % Gel USE MORNING AND NIGHT, OR AS DIRECTED     ??? levoFLOXacin (LEVAQUIN) 750 MG tablet Take 1 tablet (750 mg total) by mouth daily  for 14 days. 14 tablet 0   ??? levothyroxine (SYNTHROID) 50 MCG tablet TAKE 1 TABLET(50 MCG) BY MOUTH EVERY DAY 30 TO 60 MINUTES BEFORE BREAKFAST ON AN EMPTY STOMACH AND WITH A GLASS OF WATER     ??? multivit-min-FA-lycopen-lutein 0.4-300-250 mg-mcg-mcg Tab Take 0.5 tablets by mouth.     ??? promethazine (PHENERGAN) 25 MG suppository Insert 25 mg into the rectum every six (6) hours as needed.      ??? propranoloL (INNOPRAN XL) 80 MG 24 hr capsule TAKE 1 CAPSULE(80 MG) BY MOUTH EVERY DAY     ??? sodium chloride 3 % nebulizer solution Inhale 4 mL by nebulization two (2) times a day. 240 mL 12   ??? tobramycin, PF, (TOBI) 300 mg/5 mL nebulizer solution Inhale the contents of 1 vial (300 mg total) by nebulization every twelve (12) hours for 28 days. 280 mL 0     No current facility-administered medications for this visit.       Allergies   Allergen Reactions   ??? Doxycycline Nausea Only   ??? Erythromycin Nausea Only   ??? Sulfa (Sulfonamide Antibiotics) Rash       Patient Active Problem List   Diagnosis   ??? Bronchiectasis without complication (CMS-HCC)   ??? Pseudomonas aeruginosa colonization       Reviewed and up to date in Epic.    Appropriateness of Therapy     Is medication and dose appropriate based on diagnosis? Yes    Prescription has been clinically reviewed: Yes    Baseline Quality of Life Assessment      How many days over the past month did your bronchiectasis  keep you from your normal activities? For example, brushing your teeth or getting up in the morning. patient has been sick for a few days    Financial Information     Medication Assistance provided: None Required    Anticipated copay of $443.54 reviewed with patient. Verified delivery address.    Delivery Information     Scheduled delivery date: 12/7    Expected start date: 12/7    Medication will be delivered via Same Day Courier to the prescription address in [facility].  This shipment will require a signature.      Explained the services we provide at [facility] and that each month we would call to set up refills.  Stressed importance of returning phone calls so that we could ensure they receive their medications in time each month.  Informed patient that we should be setting up refills 7-10 days prior to when they will run out of medication.  A pharmacist will reach out to perform a clinical assessment periodically.  Informed patient that a welcome packet and a drug information handout will be sent.      Patient verbalized understanding of the above information as well as how to contact the pharmacy at [phone] option [phone] with any questions/concerns.  The pharmacy is open Monday through Friday 8:30am-4:30pm.  A pharmacist is available 24/7 via pager to answer any clinical questions they may have.    Patient Specific Needs     - Does the patient have any physical, cognitive, or cultural barriers? No    - Patient prefers to have medications discussed with  Patient     - Is the patient or caregiver able to read and understand education materials at a high school level or above? Yes    - Patient's primary language is  [place]     -  Is the patient high risk? No    - Does the patient require a Care Management Plan? No     - Does the patient require physician intervention or other additional services (i.e. nutrition, smoking cessation, social work)? No      Victoria Copeland  [facility] Specialty Pharmacist

## 2020-01-28 NOTE — Unmapped (Addendum)
Attempted to reach Ms. Piazza to ask her how her antibiotic and tobramycin inhalation are going.     Asked her to return my call when she was able. Provided her with my direct contact number.       Follow-up:  She phoned back. She started tobramycin last night and levaquin today.    Reviewed instructions. She knows to call us with any questions or side effects.     She verbalized understanding.

## 2020-02-12 NOTE — Unmapped (Signed)
Spoke to Victoria Copeland today. She has completed the antibiotics and continues on the tobramycin inhalation.     Doing well on the tobramycin inhalation.Did not report any side effects at this time.    She stated that she feels like she is not getting a lot of mucous up.    Still feeling congested and some chest tightness.    Did encourage her t added an additional saline and albuterol treatment in the afternoon to see if that helps.     Is aware that if symptoms    Advised that if any symptoms worsen or if fevers start that she may want to go in and be evaluated in urgent care or ED as we are going into the Holiday weekend.     Also has the on-call holiday number should she need to speak with anyone.

## 2020-02-16 DIAGNOSIS — J471 Bronchiectasis with (acute) exacerbation: Principal | ICD-10-CM

## 2020-02-16 MED ORDER — SODIUM CHLORIDE 0.9 % FOR NEBULIZATION
Freq: Two times a day (BID) | RESPIRATORY_TRACT | 2 refills | 60.00000 days | Status: CP
Start: 2020-02-16 — End: 2020-02-18

## 2020-02-16 NOTE — Unmapped (Signed)
Called and spoke with patient about her worsening shortness of breath. She says that since completing the levaquin she has felt worse in regards to increased coughing (mostly a dry cough) and some shortness of breath. She specifically notes that she has chest tightness and feels that she cannot catch a deep breath while doing her HTS and shortly after. Pt checks temp daily and has had no fevers. O2 saturation mid 90s at home. No producing enough sputum for sampling per pt.     - Overall concern for continued PSA infection vs new viral infection vs ABPM   - Will obtain CBC and IgE, inflmmatory markers   - Culture if able   - CXR   - Will plan to transition to normal saline nebs rather than HTS, sending to shared services pharmacy and pt will call to have med sent   - Pt will complete tobramycin 28 day course next Tuesday   - Will continue to monitor for fever/hypoxia/increased sputum > If any of these sx occur, will send for CXR, consider VRP and COVID testing depending on timeline. Discussed sx that would warrant admission to the ED   - Will attempt to move up out patient appt   - Will discuss case with Dr. Orson Aloe and bronchiectasis team

## 2020-02-17 ENCOUNTER — Ambulatory Visit: Admit: 2020-02-17 | Discharge: 2020-02-18 | Payer: MEDICARE

## 2020-02-17 DIAGNOSIS — J471 Bronchiectasis with (acute) exacerbation: Principal | ICD-10-CM

## 2020-02-18 DIAGNOSIS — Z882 Allergy status to sulfonamides status: Principal | ICD-10-CM

## 2020-02-18 DIAGNOSIS — E039 Hypothyroidism, unspecified: Principal | ICD-10-CM

## 2020-02-18 DIAGNOSIS — D721 Eosinophilia, unspecified: Principal | ICD-10-CM

## 2020-02-18 DIAGNOSIS — Z7951 Long term (current) use of inhaled steroids: Principal | ICD-10-CM

## 2020-02-18 DIAGNOSIS — J208 Acute bronchitis due to other specified organisms: Principal | ICD-10-CM

## 2020-02-18 DIAGNOSIS — K219 Gastro-esophageal reflux disease without esophagitis: Principal | ICD-10-CM

## 2020-02-18 DIAGNOSIS — Z7952 Long term (current) use of systemic steroids: Principal | ICD-10-CM

## 2020-02-18 DIAGNOSIS — Z2239 Carrier of other specified bacterial diseases: Principal | ICD-10-CM

## 2020-02-18 DIAGNOSIS — B965 Pseudomonas (aeruginosa) (mallei) (pseudomallei) as the cause of diseases classified elsewhere: Principal | ICD-10-CM

## 2020-02-18 DIAGNOSIS — Z7722 Contact with and (suspected) exposure to environmental tobacco smoke (acute) (chronic): Principal | ICD-10-CM

## 2020-02-18 DIAGNOSIS — Z79890 Hormone replacement therapy (postmenopausal): Principal | ICD-10-CM

## 2020-02-18 DIAGNOSIS — G35 Multiple sclerosis: Principal | ICD-10-CM

## 2020-02-18 DIAGNOSIS — J479 Bronchiectasis, uncomplicated: Principal | ICD-10-CM

## 2020-02-18 DIAGNOSIS — G43909 Migraine, unspecified, not intractable, without status migrainosus: Principal | ICD-10-CM

## 2020-02-18 DIAGNOSIS — G4700 Insomnia, unspecified: Principal | ICD-10-CM

## 2020-02-18 DIAGNOSIS — J471 Bronchiectasis with (acute) exacerbation: Principal | ICD-10-CM

## 2020-02-18 DIAGNOSIS — Z881 Allergy status to other antibiotic agents status: Principal | ICD-10-CM

## 2020-02-18 DIAGNOSIS — Z20822 Contact with and (suspected) exposure to covid-19: Principal | ICD-10-CM

## 2020-02-18 LAB — CBC W/ AUTO DIFF
BASOPHILS ABSOLUTE COUNT: 0.1 10*9/L (ref 0.0–0.1)
BASOPHILS RELATIVE PERCENT: 1 %
EOSINOPHILS ABSOLUTE COUNT: 0.6 10*9/L (ref 0.0–0.7)
EOSINOPHILS RELATIVE PERCENT: 5.8 %
HEMATOCRIT: 40.5 % (ref 35.0–44.0)
HEMOGLOBIN: 13.3 g/dL (ref 12.0–15.5)
LYMPHOCYTES ABSOLUTE COUNT: 1.1 10*9/L (ref 0.7–4.0)
LYMPHOCYTES RELATIVE PERCENT: 11 %
MEAN CORPUSCULAR HEMOGLOBIN CONC: 32.7 g/dL (ref 30.0–36.0)
MEAN CORPUSCULAR HEMOGLOBIN: 27.6 pg (ref 26.0–34.0)
MEAN CORPUSCULAR VOLUME: 84.5 fL (ref 82.0–98.0)
MEAN PLATELET VOLUME: 8.7 fL (ref 7.0–10.0)
MONOCYTES ABSOLUTE COUNT: 0.8 10*9/L (ref 0.1–1.0)
MONOCYTES RELATIVE PERCENT: 8.1 %
NEUTROPHILS ABSOLUTE COUNT: 7.5 10*9/L (ref 1.7–7.7)
NEUTROPHILS RELATIVE PERCENT: 74.1 %
NUCLEATED RED BLOOD CELLS: 0 /100{WBCs} (ref <=4)
PLATELET COUNT: 287 10*9/L (ref 150–450)
RED BLOOD CELL COUNT: 4.8 10*12/L (ref 3.90–5.03)
RED CELL DISTRIBUTION WIDTH: 13.4 % (ref 12.0–15.0)
WBC ADJUSTED: 10.1 10*9/L (ref 3.5–10.5)

## 2020-02-18 LAB — SEDIMENTATION RATE, MANUAL: ERYTHROCYTE SEDIMENTATION RATE: 78 mm/h — ABNORMAL HIGH (ref 0–30)

## 2020-02-18 LAB — C-REACTIVE PROTEIN: C-REACTIVE PROTEIN: 16 mg/L — ABNORMAL HIGH (ref <=10.0)

## 2020-02-18 MED ORDER — SODIUM CHLORIDE 3 % FOR NEBULIZATION
Freq: Two times a day (BID) | RESPIRATORY_TRACT | 5 refills | 30.00000 days | Status: CP
Start: 2020-02-18 — End: 2020-04-07

## 2020-02-18 MED ADMIN — sodium chloride 3 % nebulizer solution 4 mL: 4 mL | RESPIRATORY_TRACT | @ 16:00:00 | Stop: 2020-02-18

## 2020-02-18 MED ADMIN — albuterol 2.5 mg /3 mL (0.083 %) nebulizer solution 2.5 mg: 2.5 mg | RESPIRATORY_TRACT | @ 16:00:00 | Stop: 2020-02-18

## 2020-02-18 NOTE — Unmapped (Signed)
Bronchiectasis Clinic Assessment    Inhaled Medications:     Albuterol Neb   Hypertonic Saline 3%  Tobi INH       Review Ordered Inhaled Medications that are Taken, Frequency, Compliance: I reviewed the techniques and order of inhaled treatments.  All questions answered. The patient acknowledges the correct order and frequency of the inhaled medications.    Reusable nebulizer cup: Yes    Airway Clearance Used:   Patient currently has an Acapella and an air physio.  She is unable to clean the Acapella and will not longer be using it.  We reviewed the  [place] and discussed how the equipment functions, setting the resistance, assembly, and cleaning/sterilizing equipment.  We also reviewed an instructional video regarding it.  Patient will be admitted and try the [place] while in the hospital.  Patient states no issues or questions at this time.     Cough Techniques:  ACBT and huff coughing discussed and reviewed with patient and instructional video reviewed. Patient demonstrated technique.  Patient education handouts given.  All questions answered.      Equipment Review/Cleaning: Equipment cleaning reviewed. Patient had been cleaning her equipment using vinegar.  We discussed why this is not recommended and the different methods for sterilizing.  Patient will order microwave steam bags to use going forward. Cleaning and disifecting/sterilization education handout given to patient.  All questions answered.     Misc. Notes:   Patient is going to be admitted.  She has been provided with my contact information and was instructed to call or MyChart with any questions or concerns.

## 2020-02-18 NOTE — Unmapped (Signed)
Sent message to [facility] for planned admission for bronchiectasis exacerbation and IV antibiotics.     Patient will be placed on admission list but the hospital is at full capacity and admission may not happen for a few days.     I have informed Dr. Marlana Salvage.

## 2020-02-18 NOTE — Unmapped (Signed)
Pulmonary Clinic - Return Visit    Referring Physician :  Caryl Asp, FNP  PCP:     Caryl Asp, FNP  Reason for Consult:   Abnormal CT scan with pulmonary nodules, cough    ASSESSMENT and PLAN     Victoria Copeland is a 69 y.o. female with history of chronic rhinosinusitis, MS and GERD whom we are seeing for routine follow up of bronchiectasis.     # Bronchiectasis with bilateral Infiltrates, tree-in-bud nodules   - CT 01/2019 demonstrates R>L bronchiectasis, more prominent in bilateral upper lobes and lingula/RML. Additionally, had bilateral solid and mixed nodules with some tree-in-bud. CT completed 08/2019 demonstrated overall  improvement in opacities, as well as improvement in FEV1 by spirometry.   - As far as etiology of bronchiectasis, unclear; no hypogammaglobulinemia, normal IgE, HIV negative, no eosinophilia.   - Given unclear etiology of patient bronchiectasis, will plan to complete workup for CF, genetic  testing ordered   - Since 11/2019 patient has been having increase sx and decline in her PFTs    - Based on 01/05/2020 PFTs and fevers bronchoscopy was performed which showed PSA, penicillium,  and actinomyces   - Decision to treat with levaquin for 14 days and inhaled tobramycin for 28 days    - Patient with worsening sx after completing levaquin and significant decline in PFTs 12/28, will plan for admission to hospital for increased AC and IV abx (would favor treating PSA with IV abx and then repeating CT, assessing sx, and possibly repeating PFTs. If still declined, would favor treating actinomyces). Patient will await admission at home. If sx worsen she will contact us and present to the ED.   - Pt is to complete tobramycin 02/24/20  - Ordering CBC, inflammatory markers, IgE  ?? Continue 3% HTS, 7% caused oropharyngeal irritation, pre-med with albuterol   ?? Currently using an air-physio, will provide an Brazil while inpatient. RT met with pt and discussed technique and device sterilization       # Health Maintenance  ?? Covid-19: S/p J&J 4/6/2, Wants to wait until after hospitalization for booster   ?? Influenza: Wants to wait until after hospitalization   ?? Pneumovax: Would recommend  unless has received in < 5 yrs 6 months from Prevnar vaccine which was given 11/2019 (due 05/2020)   ?? Prenvar: s/p 12/02/2018, 12/02/2019  ?? Tdap: UTD 12/02/2013  ?? Lung cancer screening: Not a candidate    This patient was seen and discussed with attending physician Dr. Kearney Hard,  who agrees with the assessment and plan above.    CC: Caryl Asp, FNP    HISTORY:     Interval History   Since last visit patient has continued to decline. She tolerated tobramycin and levaquin but 1 week after stopping levaquin she has started to be more short of breath that has limited her usual activities. She complains of chest tightness and burning after completing the tobramycin. She is tolerating the HTS well. She she denies much sputum production but it is increased from her baseline since she has been doing more aggressive airway clearance and is on abx. No fevers and O2 saturation mid 90s at home.     ** Received an albuterol/HTS neb while in clinic and tolerated well, no sputum production     History of Present Illness (02/12/2019):  Victoria Copeland is a 69 y.o. female with history of chronic rhinosinusitis, MS and GERD whom we are seeing in consultation for evaluation of  abnormal CT scan and cough. Victoria Copeland reports she has had a cough for about the past year. Cough seems to wax and wane, although has become more persistent and irritating. Cough does seem to be worse with seasonal changes / allergy flares. Not worsened with PO intake and no concern for aspiration. Has minimal GERD symptoms. Cough is often dry, but notes intermittently and more recently, she is able to cough up sputum with is thick yellow/green, sticky and foul-smelling. She notes she has chronic rhinosinusitis with obvious post-nasal drip; her therapy has recently been stepped up to steroid rinses for this. She does query if some of what she coughs up is this drainage. She reports she'd have mild-to-moderate SOB with heavier exertion, although no SOB with routine activities. She denies fevers, chills, sweats.    She notes given persistence and worsening of cough, a CXR was obtained which was abnormal, and led to a CT scan completed earlier this month which demonstrated multiple pulmonary nodules (see objective section below for interpretation). She was referred to [place] for further evaluation.     She does not smoke, but notes she has had significant second hand smoke exposure taking care of her mother over the years who was a heavy smoker. No inhaled substances. No known occupational exposures. Denies significant childhood illnesses, although notes for past several years, as above, has had recurrent sinus infections for which she follows with ENT. Notes she infrequently has chest colds, but has never had a severe respiratory illness. No known bronchiectasis in family. She has had multiple children. Family history notable for asthma and acute eosinophilic pneumonia of unclear etiology in her sister. Her mother had COPD associated with significant smoking. No family history of lung cancer, although this is a big concern for Victoria Copeland given her second hand smoke exposure. No known history of rheumatologic diseases. She has MS, which she states has been slowly progressive, and she does not require any directed therapy for this (has been on no immunosuppression).    PMH: ?? Chronic rhinosinusitis / allergic rhinitis  ?? Hypothyroidism  ?? Migraines  ?? Multiple sclerosis -- slowly progressive, diagnosed 30 yrs ago, on no medications, no recent exacerbations  ?? Mitral valve Prolapse  ?? GERD, occasional     SurgHx: ?? Appendectomy  ?? Hysterectomy  ?? Tubal ligation     FH: ?? Father (deceased): Alzheimer's Disease, EtOH abuse, stroke  ?? Mother: COPD (assocated with smoking), Migraines, glaucoma, skin cancer  ?? Sister: Asthma, eosinophilic pneumonia, pan-sinusitis  ?? Maternal aunt: Ovarian cancer, throat cancer, pancreatic cancer  ?? MGM: Ovarian Cancer, DM     SocHx: ?? Tobacco: Never, although significant second hand exposure  ?? EtOH: Denies  ?? Drugs: Denies  ?? Living situation: Lives in [place], [place] built 2017, home treated for mold growth underneath house in crawl space a couple of years ago  ?? 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 Pets: None     Meds: Personally reviewed in Epic. Pertinent pulmonary meds:  ?? Budesonide - nasal/sinus rinses     Allergies: Allergies   Allergen Reactions   ??? Doxycycline Nausea Only   ??? Erythromycin Nausea Only   ??? Sulfa (Sulfonamide Antibiotics) Rash      ROS: 12-point ROS reviewed with patient with pertinent positives and negatives as per HPI.       PHYSICAL EXAM:     Vitals:    02/18/20 0934   BP: 106/70  Pulse: 64   Resp: 16   Temp: 36.2 ??C   SpO2: 97%     General: No acute distress   Eyes: EOMI, sclera anicteric  HENT: clear oropharynx  CV: RRR, no m/r/g  Lungs: Normal work of breathing without accessory muscle usage, CTA with good air movement bilaterally without wheezes or crackles  Abd: ND  Ext: no clubbing, no cyanosis  Skin: No rashes  Neuro: No focal deficits, alert & oriented    LABORATORY and RADIOLOGY DATA:     PFTs        Pertinent Laboratory Data:  Reviewed     Pertinent Imaging Data:  CT 04/2019:  IMPRESSION:   Interval improvement of patchy and nodular pulmonary parenchymal opacities with residual nodular opacities. Lingular bronchiectasis. Findings are most likely representing endobronchial infection such as MAI. Recommend 3-6 month follow-up chest CT for further evaluation.    CT 08/2019  IMPRESSION:  Persistent bronchiectasis, small airway inflammation and associated peripheral respiratory bronchiolitis, with mild increase in the bronchiolitis in the peripheral right lower lobe. 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    CT 12/2019  IMPRESSION:  ??Mild lingular bronchiectasis, and clustered nodular pulmonary parenchymal opacities most likely representing indolent small airway infection. Overall the appearance is grossly unchanged from prior studies.

## 2020-02-18 NOTE — Unmapped (Signed)
You will be called for admission to the hospital, in the meantime please call the clinic with any concerns.

## 2020-02-19 LAB — IGE: TOTAL IGE: 12.4 [IU]/mL

## 2020-02-21 ENCOUNTER — Ambulatory Visit
Admit: 2020-02-21 | Discharge: 2020-03-02 | Disposition: A | Discharge status: Home / Self Care | Payer: MEDICARE | Admitting: Internal Medicine

## 2020-02-21 ENCOUNTER — Ambulatory Visit
Admit: 2020-02-21 | Discharge: 2020-03-02 | Disposition: A | Discharge status: Home / Self Care | Payer: MEDICARE | Attending: Student in an Organized Health Care Education/Training Program | Admitting: Internal Medicine

## 2020-02-21 DIAGNOSIS — E039 Hypothyroidism, unspecified: Principal | ICD-10-CM

## 2020-02-21 DIAGNOSIS — Z20822 Contact with and (suspected) exposure to covid-19: Principal | ICD-10-CM

## 2020-02-21 DIAGNOSIS — G35 Multiple sclerosis: Principal | ICD-10-CM

## 2020-02-21 DIAGNOSIS — J208 Acute bronchitis due to other specified organisms: Principal | ICD-10-CM

## 2020-02-21 DIAGNOSIS — B965 Pseudomonas (aeruginosa) (mallei) (pseudomallei) as the cause of diseases classified elsewhere: Principal | ICD-10-CM

## 2020-02-21 DIAGNOSIS — J471 Bronchiectasis with (acute) exacerbation: Principal | ICD-10-CM

## 2020-02-21 DIAGNOSIS — K219 Gastro-esophageal reflux disease without esophagitis: Principal | ICD-10-CM

## 2020-02-21 DIAGNOSIS — Z79890 Hormone replacement therapy (postmenopausal): Principal | ICD-10-CM

## 2020-02-21 DIAGNOSIS — G43909 Migraine, unspecified, not intractable, without status migrainosus: Principal | ICD-10-CM

## 2020-02-21 DIAGNOSIS — Z7952 Long term (current) use of systemic steroids: Principal | ICD-10-CM

## 2020-02-21 DIAGNOSIS — J479 Bronchiectasis, uncomplicated: Principal | ICD-10-CM

## 2020-02-21 DIAGNOSIS — Z7951 Long term (current) use of inhaled steroids: Principal | ICD-10-CM

## 2020-02-21 DIAGNOSIS — Z882 Allergy status to sulfonamides status: Principal | ICD-10-CM

## 2020-02-21 DIAGNOSIS — Z7722 Contact with and (suspected) exposure to environmental tobacco smoke (acute) (chronic): Principal | ICD-10-CM

## 2020-02-21 DIAGNOSIS — Z881 Allergy status to other antibiotic agents status: Principal | ICD-10-CM

## 2020-02-21 DIAGNOSIS — G4700 Insomnia, unspecified: Principal | ICD-10-CM

## 2020-02-21 DIAGNOSIS — D721 Eosinophilia, unspecified: Principal | ICD-10-CM

## 2020-02-21 LAB — CBC W/ AUTO DIFF
BASOPHILS ABSOLUTE COUNT: 0.1 10*9/L (ref 0.0–0.1)
BASOPHILS RELATIVE PERCENT: 1.3 %
EOSINOPHILS ABSOLUTE COUNT: 0.6 10*9/L (ref 0.0–0.7)
EOSINOPHILS RELATIVE PERCENT: 7.2 %
HEMATOCRIT: 40.5 % (ref 35.0–44.0)
HEMOGLOBIN: 13.8 g/dL (ref 12.0–15.5)
LYMPHOCYTES ABSOLUTE COUNT: 1.1 10*9/L (ref 0.7–4.0)
LYMPHOCYTES RELATIVE PERCENT: 12.3 %
MEAN CORPUSCULAR HEMOGLOBIN CONC: 34 g/dL (ref 30.0–36.0)
MEAN CORPUSCULAR HEMOGLOBIN: 28.1 pg (ref 26.0–34.0)
MEAN CORPUSCULAR VOLUME: 82.9 fL (ref 82.0–98.0)
MEAN PLATELET VOLUME: 8.8 fL (ref 7.0–10.0)
MONOCYTES ABSOLUTE COUNT: 0.9 10*9/L (ref 0.1–1.0)
MONOCYTES RELATIVE PERCENT: 10 %
NEUTROPHILS ABSOLUTE COUNT: 5.9 10*9/L (ref 1.7–7.7)
NEUTROPHILS RELATIVE PERCENT: 69.2 %
NUCLEATED RED BLOOD CELLS: 0 /100{WBCs} (ref <=4)
PLATELET COUNT: 317 10*9/L (ref 150–450)
RED BLOOD CELL COUNT: 4.89 10*12/L (ref 3.90–5.03)
RED CELL DISTRIBUTION WIDTH: 13.3 % (ref 12.0–15.0)
WBC ADJUSTED: 8.6 10*9/L (ref 3.5–10.5)

## 2020-02-21 LAB — COMPREHENSIVE METABOLIC PANEL
ALBUMIN: 4.5 g/dL (ref 3.4–5.0)
ALKALINE PHOSPHATASE: 92 U/L (ref 46–116)
ALT (SGPT): 20 U/L (ref 10–49)
ANION GAP: 7 mmol/L (ref 5–14)
AST (SGOT): 31 U/L (ref <=34)
BILIRUBIN TOTAL: 0.4 mg/dL (ref 0.3–1.2)
BLOOD UREA NITROGEN: 11 mg/dL (ref 9–23)
BUN / CREAT RATIO: 15
CALCIUM: 11 mg/dL — ABNORMAL HIGH (ref 8.7–10.4)
CHLORIDE: 98 mmol/L (ref 98–107)
CO2: 30.3 mmol/L (ref 20.0–31.0)
CREATININE: 0.75 mg/dL
EGFR CKD-EPI AA FEMALE: >90 mL/min/{1.73_m2} (ref >=60)
EGFR CKD-EPI NON-AA FEMALE: 82 mL/min/{1.73_m2} (ref >=60)
GLUCOSE RANDOM: 92 mg/dL (ref 70–179)
POTASSIUM: 3.6 mmol/L (ref 3.4–4.5)
PROTEIN TOTAL: 9.2 g/dL — ABNORMAL HIGH (ref 5.7–8.2)
SODIUM: 135 mmol/L (ref 135–145)

## 2020-02-21 LAB — SEDIMENTATION RATE, MANUAL: ERYTHROCYTE SEDIMENTATION RATE: 32 mm/h — ABNORMAL HIGH (ref 0–30)

## 2020-02-21 LAB — MAGNESIUM: MAGNESIUM: 2.1 mg/dL (ref 1.6–2.6)

## 2020-02-21 LAB — C-REACTIVE PROTEIN: C-REACTIVE PROTEIN: 17 mg/L — ABNORMAL HIGH (ref <=10.0)

## 2020-02-21 MED ORDER — PREDNISONE 10 MG TABLET
ORAL_TABLET | ORAL | 0 refills | 0.00000 days | Status: CP
Start: 2020-02-21 — End: 2020-03-02

## 2020-02-21 MED ADMIN — pantoprazole (PROTONIX) EC tablet 20 mg: 20 mg | ORAL | @ 22:00:00 | Stop: 2020-03-02

## 2020-02-21 MED ADMIN — albuterol nebulizer solution 2.5 mg: 2.5 mg | RESPIRATORY_TRACT | @ 23:00:00 | Stop: 2020-02-21

## 2020-02-21 MED ADMIN — enoxaparin (LOVENOX) syringe 40 mg: 40 mg | SUBCUTANEOUS | @ 22:00:00 | Stop: 2020-02-22

## 2020-02-21 MED ADMIN — predniSONE (DELTASONE) tablet 40 mg: 40 mg | ORAL | @ 22:00:00 | Stop: 2020-02-25

## 2020-02-21 MED ADMIN — sodium chloride 3 % nebulizer solution 4 mL: 4 mL | RESPIRATORY_TRACT | @ 23:00:00 | Stop: 2020-02-21

## 2020-02-21 NOTE — Unmapped (Signed)
General Medicine History and Physical    Assessment/Plan:    Principal Problem:    Bronchiectasis without complication (CMS-HCC)  Active Problems:    Pseudomonas aeruginosa colonization      Victoria Copeland is a 70 y.o. female with PMHx of bronchiectasis, hypothyroidism, and MS that presented to [facility] with acute Bronchiectasis without complication (CMS-HCC).    *Bronchiectasis with tree-in-bud nodules: Currently being worked up for CF with genetic testing.  Symptoms worsened shortly after completing 14-day course of Levaquin.  Recent bronchoscopy with multiple organisms (pan-sensitive PsA, actinomyces, and penicillum).   - Obtain CF sputum culture and COVID-19 PCR  - Airway clearance with hypertonic saline nebulizers, albuterol BID, and aerobika  - Continue tobramycin nebulizer (plan for 28-day course with end date 02/24/2020)   - Start prednisone taper as follows: 40 mg x 5 days, then 30 mg x 5 days, then 20 mg x 5 days, then 10 mg x 5 days  - Consult Pulmonology for assistance with antibiotic selection (paged) - will start ceftazidime 2 g q8h per their recommendations    CHRONIC MEDICAL CONDITIONS  *Hypothyroidism: continue home Synthroid  *Insomnia: continue amitriptyline  *Multiple sclerosis: slowly progressive, diagnosed in 30s, not on any medications, no recent exacerbations    CARE CHECK LIST:  - FEN/GI: regular diet, replete lytes prn  - PPx: DVT - Lovenox, SCDs; GI - protonix (while on prednisone)  - Dispo: admit to hospitalist, floor status   - Code: full, confirmed     ___________________________________________________________________    Chief Complaint:  Shortness of breath    Bronchiectasis without complication (CMS-HCC)    HPI:  Victoria Copeland is a 70 y.o. female with PMHx as reviewed in the EMR that presented to [facility] with Bronchiectasis without complication (CMS-HCC).    Patient has been evaluated by [facility] Pulmonology for chronic cough and noted to have tree-in-bud opacities and bronchiectasis on CT chest.  She was initiated on a 14-day course of Levaquin, which she completed on 02/12/20, as well as a 28-day course of inhaled tobramycin.  Shortly after finishing the Levaquin, symptoms worsened with dry cough.  She has been doing airway clearance with BID albuterol nebulizer, hypertonic saline nebs.  After airway clearance, she notes green sticky productive sputum that is follow-smelling.  She has some moderate DOE but no SOB at rest.  She denies fevers, chills, chest pain, loss of taste/smell, myalgias, night sweats.  She received J&J COVID vaccine in April 2021 and has not yet received a booster.  Recent bronchoscopy cultures with multiple organisms (PsA, Actinomyces, Penicillum).  PsA pan-sensitive    Allergies:  Doxycycline, Erythromycin, and Sulfa (sulfonamide antibiotics)    Medications:   Prior to Admission medications    Medication Dose, Route, Frequency   albuterol 2.5 mg/0.5 mL nebulizer solution 2.5 mg, Nebulization, 2 times a day (standard)   amitriptyline (ELAVIL) 10 MG tablet 10 mg, Oral   budesonide (PULMICORT) 0.5 mg/2 mL nebulizer solution Pt states it's a nasal/ sinus saline rinse- not nebulizer   calcium carbonate-vitamin D3 600 mg(1,500mg ) -400 unit per tablet 1 tablet, Oral   conjugated estrogens (PREMARIN) 0.625 mg/gram vaginal cream 0.5 g, Vaginal   dexAMETHasone 0.5 mg/5 mL elixir 5 mL, Oral   fluoride, sodium, 1.1 % Gel USE MORNING AND NIGHT, OR AS DIRECTED   levothyroxine (SYNTHROID) 50 MCG tablet TAKE 1 TABLET(50 MCG) BY MOUTH EVERY DAY 30 TO 60 MINUTES BEFORE BREAKFAST ON AN EMPTY STOMACH AND WITH A GLASS OF WATER   multivit-min-FA-lycopen-lutein 0.4-300-250 mg-mcg-mcg  Tab 0.5 tablets, Oral   nebulizers Misc USE AS DIRECTED   predniSONE (DELTASONE) 10 MG tablet Please take 4 tablets for 5 days, then 3 tabs for 5 days, then 2 tabs for 5 days, then 1 tab for 5 days.   promethazine (PHENERGAN) 25 MG suppository 25 mg, Rectal, Every 6 hours PRN   propranoloL (INNOPRAN XL) 80 MG 24 hr capsule TAKE 1 CAPSULE(80 MG) BY MOUTH EVERY DAY   sodium chloride 3 % nebulizer solution 4 mL, Nebulization, 2 times a day   tobramycin, PF, (TOBI) 300 mg/5 mL nebulizer solution Inhale the contents of 1 vial (300 mg total) by nebulization every twelve (12) hours for 28 days.       Medical History:  Past Medical History:   Diagnosis Date   ??? Allergic rhinitis, unspecified    ??? GERD (gastroesophageal reflux disease)    ??? Hypothyroidism    ??? Migraines    ??? Mitral valve prolapse        Surgical History:  Past Surgical History:   Procedure Laterality Date   ??? PR BRONCHOSCOPY,DIAGNOSTIC W LAVAGE Bilateral 01/19/2020    Procedure: BRONCHOSCOPY, RIGID OR FLEXIBLE, INCLUDE FLUOROSCOPIC GUIDANCE WHEN PERFORMED; W/BRONCHIAL ALVEOLAR LAVAGE WITH MODERATE SEDATION;  Surgeon: Baxter Kail, MD;  Location: BRONCH PROCEDURE LAB [facility];  Service: Pulmonary       Social History:  Social History     Socioeconomic History   ??? Marital status: Married     Spouse name: Not on file   ??? Number of children: Not on file   ??? Years of education: Not on file   ??? Highest education level: Not on file   Occupational History   ??? Not on file   Tobacco Use   ??? Smoking status: Passive Smoke Exposure - Never Smoker   ??? Smokeless tobacco: Never Used   ??? Tobacco comment: seond hand exposure   Substance and Sexual Activity   ??? Alcohol use: Not Currently   ??? Drug use: Never   ??? Sexual activity: Not on file   Other Topics Concern   ??? Not on file   Social History Narrative   ??? Not on file     Social Determinants of Health     Financial Resource Strain: Not on file   Food Insecurity: Not on file   Transportation Needs: Not on file   Physical Activity: Not on file   Stress: Not on file   Social Connections: Not on file     Alcohol Use: Not on file       Family History:  Father - deceased, Alzheimer's  Mother - COPD (smoker)   Sister - asthma, eosinophilic pneumonia    Review of Systems:  10 systems reviewed and are negative unless otherwise mentioned in HPI    Labs/Studies:  Labs and Studies from the last 24hrs per EMR and Reviewed    Physical Exam:  Temp:  [35.6 ??C (96.1 ??F)] 35.6 ??C (96.1 ??F)  Heart Rate:  [79] 79  Resp:  [20] 20  BP: (160)/(79) 160/79  SpO2:  [97 %] 97 %    General: well appearing female in NAD  HEENT: anicteric sclera, moist mucous membranes, wearing mask  Neck: supple, no palpable LAD  CV: RRR, no murmurs, rubs, or gallops  Pulm: normal work of breathing, faint end-expiratory wheezes, good air movement bilaterally, no crackles  Abd: soft, non-tender, non-distended, no organomegaly or masses, bowel sounds present  Ext: warm and well perfused, no edema  Skin: no rashes or petechiae on grossly visible areas  Neuro: grossly intact  Psych: appropriate mood and affect

## 2020-02-21 NOTE — Unmapped (Signed)
Spoke to Dr. Marlana Salvage  - Will plan to start prednisone and taper (40 mg x 5 days, 30 mg x 5 days, 20 mg x 5 days, and 10 mg x 5 days)  - Will plan to stop tobramycin on Tuesday  - Awaiting bed placement  - Will follow-up on Monday/Tuesday    Eustaquio Boyden, MD  [facility] Pulmonary and Critical Care Medicine Fellow  Personal pager: [phone] - 24-hr consult pager: [phone]

## 2020-02-21 NOTE — Unmapped (Signed)
Patient called into after-hours pulmonary care line    She's awaiting bed placement to the hospital at this time. She's attempting airway clearance 3x daily and is compliant with her other medications (including her inhaled tobramycin and Levaquin. Inhaled tobramycin to end this Tuesday). She's able to maintain her O2 saturations above 92% without supplemental O2 (which she does not have at home). Still not feeling well. No new chest pain/syncope/shortness of breath. She's speaking in complete sentences on the phone without respiratory distress.   - At this time, will continue to monitor at home  - If new symptoms develop, will go to the ED  - Currently slated for admission at [facility], but no beds are available (we spoke with MAO)  - Given elevated peripheral eosinophils, will consider prednisone. I'll reach out to Dr. Marlana Salvage.     Discussed with Dr. Isac Caddy, MD  [facility] Pulmonary and Critical Care Medicine Fellow  Personal pager: [phone] - 24-hr consult pager: [phone]

## 2020-02-22 LAB — CBC W/ AUTO DIFF
BASOPHILS ABSOLUTE COUNT: 0 10*9/L (ref 0.0–0.1)
BASOPHILS RELATIVE PERCENT: 0.3 %
EOSINOPHILS ABSOLUTE COUNT: 0 10*9/L (ref 0.0–0.7)
EOSINOPHILS RELATIVE PERCENT: 0 %
HEMATOCRIT: 36.6 % (ref 35.0–44.0)
HEMOGLOBIN: 12.1 g/dL (ref 12.0–15.5)
LYMPHOCYTES ABSOLUTE COUNT: 0.8 10*9/L (ref 0.7–4.0)
LYMPHOCYTES RELATIVE PERCENT: 12.9 %
MEAN CORPUSCULAR HEMOGLOBIN CONC: 33.2 g/dL (ref 30.0–36.0)
MEAN CORPUSCULAR HEMOGLOBIN: 27.4 pg (ref 26.0–34.0)
MEAN CORPUSCULAR VOLUME: 82.6 fL (ref 82.0–98.0)
MEAN PLATELET VOLUME: 9 fL (ref 7.0–10.0)
MONOCYTES ABSOLUTE COUNT: 0.4 10*9/L (ref 0.1–1.0)
MONOCYTES RELATIVE PERCENT: 5.6 %
NEUTROPHILS ABSOLUTE COUNT: 5.3 10*9/L (ref 1.7–7.7)
NEUTROPHILS RELATIVE PERCENT: 81.2 %
NUCLEATED RED BLOOD CELLS: 0 /100{WBCs} (ref <=4)
PLATELET COUNT: 294 10*9/L (ref 150–450)
RED BLOOD CELL COUNT: 4.43 10*12/L (ref 3.90–5.03)
RED CELL DISTRIBUTION WIDTH: 13.3 % (ref 12.0–15.0)
WBC ADJUSTED: 6.5 10*9/L (ref 3.5–10.5)

## 2020-02-22 LAB — COMPREHENSIVE METABOLIC PANEL
ALBUMIN: 3.7 g/dL (ref 3.4–5.0)
ALKALINE PHOSPHATASE: 77 U/L (ref 46–116)
ALT (SGPT): 12 U/L (ref 10–49)
ANION GAP: 9 mmol/L (ref 5–14)
AST (SGOT): 22 U/L (ref <=34)
BILIRUBIN TOTAL: 0.3 mg/dL (ref 0.3–1.2)
BLOOD UREA NITROGEN: 12 mg/dL (ref 9–23)
BUN / CREAT RATIO: 18
CALCIUM: 10.1 mg/dL (ref 8.7–10.4)
CHLORIDE: 101 mmol/L (ref 98–107)
CO2: 26.4 mmol/L (ref 20.0–31.0)
CREATININE: 0.68 mg/dL
EGFR CKD-EPI AA FEMALE: >90 mL/min/{1.73_m2} (ref >=60)
EGFR CKD-EPI NON-AA FEMALE: 90 mL/min/{1.73_m2} (ref >=60)
GLUCOSE RANDOM: 141 mg/dL (ref 70–179)
POTASSIUM: 3.9 mmol/L (ref 3.4–4.5)
PROTEIN TOTAL: 7.6 g/dL (ref 5.7–8.2)
SODIUM: 136 mmol/L (ref 135–145)

## 2020-02-22 LAB — MAGNESIUM: MAGNESIUM: 1.9 mg/dL (ref 1.6–2.6)

## 2020-02-22 MED ADMIN — cefTAZidime (FORTAZ) 2 g in sodium chloride 0.9 % (NS) 100 mL IVPB-MBP: 2 g | INTRAVENOUS | @ 11:00:00 | Stop: 2020-03-01

## 2020-02-22 MED ADMIN — albuterol 2.5 mg /3 mL (0.083 %) nebulizer solution 2.5 mg: 2.5 mg | RESPIRATORY_TRACT | @ 14:00:00 | Stop: 2020-02-25

## 2020-02-22 MED ADMIN — amitriptyline (ELAVIL) tablet 10 mg: 10 mg | ORAL | @ 01:00:00 | Stop: 2020-02-25

## 2020-02-22 MED ADMIN — multivitamins, therapeutic with minerals tablet: ORAL | @ 14:00:00 | Stop: 2020-02-22

## 2020-02-22 MED ADMIN — tobramycin (PF) (TOBI) 300 mg/5 mL nebulizer solution 300 mg: 300 mg | RESPIRATORY_TRACT | @ 01:00:00 | Stop: 2020-02-25

## 2020-02-22 MED ADMIN — tobramycin (PF) (TOBI) 300 mg/5 mL nebulizer solution 300 mg: 300 mg | RESPIRATORY_TRACT | @ 14:00:00 | Stop: 2020-02-25

## 2020-02-22 MED ADMIN — predniSONE (DELTASONE) tablet 40 mg: 40 mg | ORAL | @ 14:00:00 | Stop: 2020-02-25

## 2020-02-22 MED ADMIN — albuterol 2.5 mg /3 mL (0.083 %) nebulizer solution 2.5 mg: 2.5 mg | RESPIRATORY_TRACT | @ 21:00:00 | Stop: 2020-02-25

## 2020-02-22 MED ADMIN — cefTAZidime (FORTAZ) 2 g in sodium chloride 0.9 % (NS) 100 mL IVPB-MBP: 2 g | INTRAVENOUS | @ 19:00:00 | Stop: 2020-03-01

## 2020-02-22 MED ADMIN — sodium chloride 3 % nebulizer solution 4 mL: 4 mL | RESPIRATORY_TRACT | @ 21:00:00 | Stop: 2020-02-25

## 2020-02-22 MED ADMIN — sodium chloride 3 % nebulizer solution 4 mL: 4 mL | RESPIRATORY_TRACT | @ 14:00:00 | Stop: 2020-02-25

## 2020-02-22 MED ADMIN — pantoprazole (PROTONIX) EC tablet 20 mg: 20 mg | ORAL | @ 14:00:00 | Stop: 2020-03-02

## 2020-02-22 MED ADMIN — sodium chloride 3 % nebulizer solution 4 mL: 4 mL | RESPIRATORY_TRACT | @ 01:00:00 | Stop: 2020-02-25

## 2020-02-22 MED ADMIN — albuterol 2.5 mg /3 mL (0.083 %) nebulizer solution 2.5 mg: 2.5 mg | RESPIRATORY_TRACT | @ 01:00:00 | Stop: 2020-02-25

## 2020-02-22 MED ADMIN — levothyroxine (SYNTHROID) tablet 50 mcg: 50 ug | ORAL | @ 11:00:00 | Stop: 2020-02-25

## 2020-02-22 NOTE — Unmapped (Signed)
Hospitalist Daily Progress Note     LOS: 1 day under inpatient status    Assessment/Plan:  Principal Problem:    Bronchiectasis without complication (CMS-HCC)  Active Problems:    Pseudomonas aeruginosa colonization  Resolved Problems:    * No resolved hospital problems. *         *Bronchiectasis exacerbation.  Bronchiectasis of unclear etiology (no hypogammaglobulinemia, NL IgE to date, no significant eosinophilia).  Undergoing outpt eval for CF (awaiting results).  Previous sputum cultures w Actinomyces species, P aeruginosa, Penicillium species.  No improvement on levofloxacin (12/7-12/21).  FEV1 43%, down from 62% 12/2019.  -Continue ceftaz (started 1/1) w intent of this admission to treat P aeruginosa w additional treatment for Actinomyces if no improvement.  -Continue tobra nebs through 1/4 as previously planned.  -Continue airway clearance w 3% saline nebs, albuterol, Aerobika.  -Continue prednisone taper started 1/1 (40mg  x5d, 30mg  x 5d, 20mg  x 5d, 10mg  x 5d) for peripheral eosinophilia per Pulm.  -Continue PPI for now while on prednisone but does not need this long-term.  -F/U IgE level.  -Consulting Pulm; appreciate their assistance.  Will defer to Pulm decision to obtain inpt PFTs or repeat chest CT imaging.    *Hypothyroid.  Stable.  -Continue L-thyroxine.    *MS.  Controlled off meds.    *Migraines, insomnia, h/o palpitations.  -Continue amitriptyline, propranolol.    *DVT prophylaxis.  -DC enoxaparin.  -Ambulation.    *Dispo.  Pending above.      Please page the [facility]) pager at [phone] with questions.        Consultants:   1.  Pulm    Pending labs:   Pending Labs     Order Current Status    IgE Total In process            Subjective:   No PRN meds in last 24 hrs.  Feels slightly better but not sure if because of prednisone.  Actually felt better (though not close to NL) when on levofloxacin at home but as soon as came off it began feeling worse.  Able to walk around room w/o problem.  Chest tightness but no chest pain.    Today's labs personally reviewed by me.      Objective:   Physical Exam:  General: Sitting comfortably in bed, no acute distress.   Eyes: No conjunctival injection or icterus.     ENT: Mucous membranes moist.  Normal appearing ears, nose.   Neck: Midline trachea.  No obvious goiter.   Respiratory: Normal respiratory effort.  ?occasional crackle but no real crackles.  Faint wheeze.   Cardiovascular: Regular rhythm w/o murmur.  Radial pulses 2+ bilaterally.  No lower extremity edema.   Gastrointestinal: Nontender, nondistended.   Skin: No obvious rashes or breakdown.  Warm, dry.   Musculoskeletal: Motor strength in all extremities grossly intact.   Psychiatric: Alert.  Answers questions appropriately.  Judgment and insight intact.   Neurologic: Extraocular movements intact, no facial asymmetry.  No gross sensory deficits.       Vital signs in last 24 hours:  Temp:  [35.6 ??C (96.1 ??F)-36.4 ??C (97.5 ??F)] 36.4 ??C (97.5 ??F)  Heart Rate:  [73-79] 73  Resp:  [18-20] 18  BP: (129-160)/(64-79) 129/64  MAP (mmHg):  [90] 90  SpO2:  [93 %-97 %] 93 %  BMI (Calculated):  [22.56] 22.56    Intake/Output last 24 hours:    Intake/Output Summary (Last 24 hours) at 02/22/2020 0601  Last data filed at 02/21/2020 1504  Gross per 24 hour   Intake 240 ml   Output ???   Net 240 ml       Most recent recorded weights:  Last 5 Recorded Weights    02/21/20 1515   Weight: 56 kg (123 lb 6.4 oz)       Medications:   Scheduled Meds:  ??? albuterol  2.5 mg Nebulization TID (RT)   ??? amitriptyline  10 mg Oral Nightly   ??? cefTAZidime  2 g Intravenous [facility]   ??? enoxaparin (LOVENOX) injection  40 mg Subcutaneous Q24H   ??? levothyroxine  50 mcg Oral daily   ??? multivitamins, therapeutic with minerals   Oral Daily   ??? pantoprazole  20 mg Oral Daily   ??? predniSONE  40 mg Oral Daily    Followed by   ??? [START ON 02/26/2020] predniSONE  30 mg Oral Daily    Followed by   ??? [START ON 03/02/2020] predniSONE  20 mg Oral Daily Followed by   ??? [START ON 03/07/2020] predniSONE  10 mg Oral Daily   ??? propranoloL  20 mg Oral TID   ??? sodium chloride  4 mL Nebulization TID (RT)   ??? tobramycin (PF)  300 mg Nebulization BID (RT)

## 2020-02-22 NOTE — Unmapped (Signed)
Adult Inpatient Initial Assessment        Victoria Copeland is a 70 y.o. female who was admitted for bronchiectasis exacerbation.    Heart Rate:   Respiratory Rate:   Bilateral Breath Sounds:     Cough/Sputum: How Much? Patient reports difficulty in sputum clearance.                                   Are they coughing up blood? No                                                                                                           Home medication & frequency :    Albuterol Neb 2.5mg  TID  Hypertonic Saline TID  Tobi INH 300mg  BID    Home airway clearance & frequency:    Acapella is used for air way clearance at home, however patient states she does not tolerate airway clearance device well. Patient reports she frequently needs to stop her saline treatment due to intolerance, and additionally notes increased heart rate with airway clearance device at home, adding she is not certain if increase in heart rate may be attributed to anxiety. Patient states she was recommended to try Aerobika device at clinic, and would like to use inline with HHN treatments.       Times Chosen for Inhaled Medications and Airway Clearance:  Between 0900- 1000     Methods tried & found to be ineffective:       Home O2? No                      If yes, what is their home requirement:     Home non-invasive ventilation? No                                                           If yes, enter type of device, settings, & mode:    Airway clearance technique method offered & decided upon:     Comments:

## 2020-02-22 NOTE — Unmapped (Signed)
Care Management  Initial Transition Planning Assessment    CM initial assessment completed telephonically as a precautionary measure in accordance with [facility] COVID-19 Pandemic emergency response plan. Patient verbalized understanding and agreement with completing this assessment by telephone.       Per EMR, Victoria Copeland  a 70 y.o. female with PMHx of bronchiectasis, hypothyroidism, and MS that presented to [facility] with acute Bronchiectasis without complication (CMS-HCC).    Victoria Copeland lives with her husband. Victoria Copeland report she is independent w/ ADL's has nebulizer at home, no other DME reported. Husband and son are her primary support and ride home.       Medical Provider(s): Caryl Asp, FNP  Primary Insurance- Payor: MEDICARE / Plan: MEDICARE PART A AND PART B / Product Type: *No Product type* /   Secondary Insurance ??? Secondary Insurance  AETNA  Prescription Coverage ???   Preferred Pharmacy - [id number] - [place], [place] - [address] [address] AT [facility] OF [address]  [facility]  At this time the patient is able to make  her own decisions.  Transportation home: Private vehicle  Level of function prior to admission: Independent    Reason for Admission: Admitting Diagnosis:  bronchiectasis exacerbation    Previous admit date: N/A             General  Care Manager assessed the patient by : Telephone conversation with patient,Medical record review (has not been r/o covid)  Orientation Level: Oriented X4  Functional level prior to admission: Independent  Reason for referral: Discharge Planning,Home Health    Contact/Decision Maker  Extended Emergency Contact Information  Primary Emergency Contact: Leard,ken  Address: [address]           [address]           [place], [place] [address] [place]  Home Phone: [phone]  Work Phone: [phone]  Mobile Phone: [phone]  Relation: Spouse  Secondary Emergency Contact: Flonnie Overman  Mobile Phone: [phone]  Relation: Son    Armed forces operational officer Next of Kin / Guardian / POA / Advance Directives     HCDM (patient stated preference): Mckeon,ken - Spouse - [phone]    Advance Directive (Medical Treatment)  Does patient have an advance directive covering medical treatment?: Patient does not have advance directive covering medical treatment.  Reason patient does not have an advance directive covering medical treatment:: Patient does not wish to complete one at this time.    Health Care Decision Maker [HCDM] (Medical & Mental Health Treatment)  Healthcare Decision Maker: HCDM documented in the HCDM/Contact Info section.  Information offered on HCDM, Medical & Mental Health advance directives:: Patient declined information.    Advance Directive (Mental Health Treatment)  Does patient have an advance directive covering mental health treatment?: Patient does not have advance directive covering mental health treatment.  Reason patient does not have an advance directive covering mental health treatment:: HCDM documented in the HCDM/Contact Info section.    Patient Information  Lives with: Spouse/significant other    Type of Residence: Private residence   Location/Detail: [place] Victoria Copeland lives in 2 story home  has 3 steps w/ railings  Support Systems/Concerns: Spouse,Children  Responsibilities/Dependents at home?: No  Home Care services in place prior to admission?: No   Equipment Currently Used at Home: respiratory supplies (nebulizer)   Currently receiving outpatient dialysis?: No     Financial Information     Need for financial  assistance?: No     Social Determinants of Health  Social Determinants of Health     Tobacco Use: Medium Risk   ??? Smoking Tobacco Use: Passive Smoke Exposure - Never Smoker   ??? Smokeless Tobacco Use: Never Used   Alcohol Use: Not on file   Financial Resource Strain: Low Risk    ??? Difficulty of Paying Living Expenses: Not hard at all   Food Insecurity: No Food Insecurity   ??? Worried About Programme researcher, broadcasting/film/video in the Last Year: Never true   ??? Ran Out of Food in the Last Year: Never true   Transportation Needs: No Transportation Needs   ??? Lack of Transportation (Medical): No   ??? Lack of Transportation (Non-Medical): No   Physical Activity: Not on file   Stress: Not on file   Social Connections: Not on file   Intimate Partner Violence: Not on file   Depression: Not on file   Housing/Utilities: Low Risk    ??? Within the past 12 months, have you ever stayed: outside, in a car, in a tent, in an overnight shelter, or temporarily in someone else's home (i.e. couch-surfing)?: No   ??? Are you worried about losing your housing?: No   ??? Within the past 12 months, have you been unable to get utilities (heat, electricity) when it was really needed?: No   Substance Use: Not on file   Health Literacy: Not on file       Discharge Needs Assessment  Concerns to be Addressed: discharge planning,adjustment to diagnosis/illness    Clinical Risk Factors: > 65,Multiple Diagnoses (Chronic),New Diagnosis  Barriers to taking medications: No  Prior overnight hospital stay or ED visit in last 90 days: No  Readmission Within the Last 30 Days: no previous admission in last 30 days  Anticipated Changes Related to Illness: none  Equipment Needed After Discharge: none    Discharge Facility/Level of Care Needs: other (see comments) (Home w/ possible HI)    Readmission  Risk of Unplanned Readmission Score: UNPLANNED READMISSION SCORE: 8%  Predictive Model Details          8% (Low)  Factor Value    Calculated 02/21/2020 16:04 47% Number of active Rx orders 33    Welch Risk of Unplanned Readmission Model 14% Imaging order present in last 6 months     11% Age 84     9% Active anticoagulant Rx order present     9% Active corticosteroid Rx order present     5% Charlson Comorbidity Index 2     4% Future appointment scheduled     2% Active ulcer medication Rx order present     0% Current length of stay 0.054 days      Readmitted Within the Last 30 Days? (No if blank)   Patient at risk for readmission?: Yes    Discharge Plan  Screen findings are: Discharge planning needs identified or anticipated (Comment). (Possibly will need HI)    Expected Discharge Date: TBD    Expected Transfer from Critical Care:  NA    Quality data for continuing care services shared with patient and/or representative?: Yes  Patient and/or family were provided with choice of facilities / services that are available and appropriate to meet post hospital care needs?: Yes   List choices in order highest to lowest preferred, if applicable. : Victoria Copeland stated no preference for agency    Initial Assessment complete?: Yes

## 2020-02-22 NOTE — Unmapped (Signed)
Pt admitted (direct admit) this shift for bronchiectasis.  Pt oriented to unit/room.  PIV inserted and blood work sent to lab.  Pt to start IV antibiotics tomorrow.  Pt had CXR done this shift.  Per MD ok for pt to have own cough drops in room.  Pt's husband at bedside currently.    Problem: Adult Inpatient Plan of Care  Goal: Plan of Care Review  Outcome: Progressing  Goal: Patient-Specific Goal (Individualized)  Outcome: Progressing  Goal: Absence of Hospital-Acquired Illness or Injury  Outcome: Progressing  Intervention: Prevent and Manage VTE (Venous Thromboembolism) Risk  Recent Flowsheet Documentation  Taken 02/21/2020 1504 by Francena Hanly, RN  Activity Management: up ad lib  Goal: Optimal Comfort and Wellbeing  Outcome: Progressing  Goal: Readiness for Transition of Care  Outcome: Progressing  Goal: Rounds/Family Conference  Outcome: Progressing

## 2020-02-22 NOTE — Unmapped (Signed)
PULMONARY CONSULT  NOTE      Patient: Victoria Copeland(06-08-1950)  Reason for consultation: Victoria Copeland is a 70 y.o. female who is seen in consultation at the request of Anell Barr, MD for comprehensive evaluation of bronchiectasis.    Assessment and Recommendations:      Principal Problem:    Bronchiectasis without complication (CMS-HCC)  Active Problems:    Pseudomonas aeruginosa colonization  Resolved Problems:    * No resolved hospital problems. *    1.  Bronchiectasis with acute exacerbation     - As far as etiology of bronchiectasis, unclear; no hypogammaglobulinemia, normal IgE, HIV negative, no eosinophilia. CF genotype pending. - Since 11/2019 patient has been having increase sx and decline in her PFTs     Initially diagnosed 12/20 and has R>L bronchiectasis, more prominent in bilateral upper lobes and lingula/RML. Additionally, had bilateral solid and mixed nodules with some tree-in-bud. Clinically and on PFTs she was worsening so had bronchoscopy as does not produce sputum that showed PSA, penicillium, and actinomyces.  She was started on oral Levaquin and inhaled tobramycin for 28 days without significant improvement thus admission to hospital for increased airway clearance and IV abx.  Plan was to treat PSA with IV abx and based on response treat actinomyces. Due to her elevated eos and symptoms prednisone started as well  - taper planned (40 mg x 5 days, 30 mg x 5 days, 20 mg x 5 days, and 10 mg x 5 days)     ?? Continue 3% HTS, 7% caused oropharyngeal irritation, pre-med with albuterol and aerobika (just started)    We appreciate the opportunity to assist in the care of this patient.  Please page [phone] with any questions.    Pablo Ledger, MD    Subjective:      History of Present Illness:  Victoria Copeland is a 70 y.o. female admitted for bronchiectasis exacerbation.   She has a history of bronchiectasis of unclear etiology diagnosed 01/2019. Recent bronchoscopy cultures with multiple organisms (PsA, Actinomyces, Penicillium). Symptoms and PFTs worsened despite Levofloxacin and inhaled tobramycin when seen in clinic 02/18/20 thus plan for direct admission for IV antibiotics directed at PsA, oral steroids, increased airway clearance (just started [place]).  As well does have Cystic Fibrosis genetic testing pending.  Today she reports some mild improvement she thinks from prednisone with chest tightness.  Still does not have much productive cough but does cough a lot.     Review of Systems: A comprehensive review of systems was performed and was negative except as above in HPI  Past Medical History:   Diagnosis Date   ??? Allergic rhinitis, unspecified    ??? GERD (gastroesophageal reflux disease)    ??? Hypothyroidism    ??? Migraines    ??? Mitral valve prolapse      Past Surgical History:   Procedure Laterality Date   ??? PR BRONCHOSCOPY,DIAGNOSTIC W LAVAGE Bilateral 01/19/2020    Procedure: BRONCHOSCOPY, RIGID OR FLEXIBLE, INCLUDE FLUOROSCOPIC GUIDANCE WHEN PERFORMED; W/BRONCHIAL ALVEOLAR LAVAGE WITH MODERATE SEDATION;  Surgeon: Baxter Kail, MD;  Location: [facility] PROCEDURE LAB [facility];  Service: Pulmonary     Medications reviewed in Epic  Allergies as of 02/18/2020 - Reviewed 02/18/2020   Allergen Reaction Noted   ??? Doxycycline Nausea Only 12/02/2013   ??? Erythromycin Nausea Only 12/08/2014   ??? Sulfa (sulfonamide antibiotics) Rash 09/07/2015     No family history on file.  Social History     Tobacco Use   ???  Smoking status: Passive Smoke Exposure - Never Smoker   ??? Smokeless tobacco: Never Used   ??? Tobacco comment: seond hand exposure   Substance Use Topics   ??? Alcohol use: Not Currently        Objective:      Physical Exam:  Vitals:    02/21/20 1515 02/21/20 2020 02/21/20 2048 02/22/20 0640   BP:   129/64 137/65   Pulse:  74 73 60   Resp:  18 18 18    Temp:   36.4 ??C (97.5 ??F) 35.9 ??C (96.7 ??F)   TempSrc:   Oral Oral   SpO2:  94% 93% 95%   Weight: 56 kg (123 lb 6.4 oz)      Height: 157.5 cm (5' 2) General: Alert, well-appearing, and in no distress.  Eyes: Anicteric sclera, conjunctiva clear.  ENT:  Mucous membranes moist and intact.  Lymph: No cervical or supraclavicular adenopathy.  Lungs: Diffuse bilateral end-expiratory wheezing.  Cardiovascular: Regular rate and rhythm, S1, S2 normal, no murmur, click, rub or gallop appreciated.  Abdomen: Soft, non-tender, not distended, bowel sounds are normal  Musculoskeletal: No synovitis.  Skin: No rashes or lesions.  Neuro: No focal neurological deficits.    Malnutrition Assessment by RD:          Diagnostic Review:   All labs and images were personally reviewed.

## 2020-02-23 LAB — IGE: TOTAL IGE: 5.21 [IU]/mL

## 2020-02-23 MED ADMIN — sodium chloride 3 % nebulizer solution 4 mL: 4 mL | RESPIRATORY_TRACT | @ 01:00:00 | Stop: 2020-02-25

## 2020-02-23 MED ADMIN — propranoloL (INDERAL) tablet 40 mg: 40 mg | ORAL | @ 15:00:00 | Stop: 2020-02-25

## 2020-02-23 MED ADMIN — cefTAZidime (FORTAZ) 2 g in sodium chloride 0.9 % (NS) 100 mL IVPB-MBP: 2 g | INTRAVENOUS | @ 20:00:00 | Stop: 2020-03-01

## 2020-02-23 MED ADMIN — albuterol 2.5 mg /3 mL (0.083 %) nebulizer solution 2.5 mg: 2.5 mg | RESPIRATORY_TRACT | @ 19:00:00 | Stop: 2020-02-25

## 2020-02-23 MED ADMIN — cefTAZidime (FORTAZ) 2 g in sodium chloride 0.9 % (NS) 100 mL IVPB-MBP: 2 g | INTRAVENOUS | @ 04:00:00 | Stop: 2020-03-01

## 2020-02-23 MED ADMIN — tobramycin (PF) (TOBI) 300 mg/5 mL nebulizer solution 300 mg: 300 mg | RESPIRATORY_TRACT | @ 13:00:00 | Stop: 2020-02-25

## 2020-02-23 MED ADMIN — sodium chloride 3 % nebulizer solution 4 mL: 4 mL | RESPIRATORY_TRACT | @ 13:00:00 | Stop: 2020-02-25

## 2020-02-23 MED ADMIN — albuterol 2.5 mg /3 mL (0.083 %) nebulizer solution 2.5 mg: 2.5 mg | RESPIRATORY_TRACT | @ 13:00:00 | Stop: 2020-02-25

## 2020-02-23 MED ADMIN — amitriptyline (ELAVIL) tablet 10 mg: 10 mg | ORAL | @ 02:00:00 | Stop: 2020-02-25

## 2020-02-23 MED ADMIN — levothyroxine (SYNTHROID) tablet 50 mcg: 50 ug | ORAL | @ 11:00:00 | Stop: 2020-02-25

## 2020-02-23 MED ADMIN — sodium chloride 3 % nebulizer solution 4 mL: 4 mL | RESPIRATORY_TRACT | @ 19:00:00 | Stop: 2020-02-25

## 2020-02-23 MED ADMIN — predniSONE (DELTASONE) tablet 40 mg: 40 mg | ORAL | @ 15:00:00 | Stop: 2020-02-25

## 2020-02-23 MED ADMIN — pantoprazole (PROTONIX) EC tablet 20 mg: 20 mg | ORAL | @ 15:00:00 | Stop: 2020-03-02

## 2020-02-23 MED ADMIN — tobramycin (PF) (TOBI) 300 mg/5 mL nebulizer solution 300 mg: 300 mg | RESPIRATORY_TRACT | @ 01:00:00 | Stop: 2020-02-25

## 2020-02-23 MED ADMIN — cefTAZidime (FORTAZ) 2 g in sodium chloride 0.9 % (NS) 100 mL IVPB-MBP: 2 g | INTRAVENOUS | @ 14:00:00 | Stop: 2020-03-01

## 2020-02-23 MED ADMIN — albuterol 2.5 mg /3 mL (0.083 %) nebulizer solution 2.5 mg: 2.5 mg | RESPIRATORY_TRACT | @ 01:00:00 | Stop: 2020-02-25

## 2020-02-23 MED ADMIN — propranoloL (INDERAL) tablet 40 mg: 40 mg | ORAL | @ 02:00:00 | Stop: 2020-02-25

## 2020-02-23 NOTE — Unmapped (Signed)
Patient was compliant with all inhaled scheduled medications and airway clearance today. After discussion about airway clearance and huff cough, Victoria Copeland decided to try VibraLung for airway clearance and after the initial treatment with the device, she is willing to try again.Resting comfortably, will contninue to monitor.

## 2020-02-23 NOTE — Unmapped (Signed)
All scheduled meds and airway clearance given as ordered with no complications noted this shift.

## 2020-02-23 NOTE — Unmapped (Signed)
Patient Aox4. Patient still needing a sputum sample and patient is aware. Patient states she is starting to feel better than she has the past few weeks. Visitor at bedside. Receiving IV antibiotic. Call bell within reach and notified to call for assistance.     Problem: Infection  Goal: Absence of Infection Signs and Symptoms  Outcome: Ongoing - Unchanged  Intervention: Prevent or Manage Infection  Recent Flowsheet Documentation  Taken 02/22/2020 1600 by Nadine Counts, RN  Isolation Precautions: contact precautions maintained  Taken 02/22/2020 1400 by Nadine Counts, RN  Isolation Precautions: contact precautions maintained  Taken 02/22/2020 1200 by Nadine Counts, RN  Isolation Precautions: contact precautions maintained  Taken 02/22/2020 1000 by Nadine Counts, RN  Isolation Precautions: contact precautions maintained  Taken 02/22/2020 0915 by Nadine Counts, RN  Isolation Precautions: contact precautions maintained

## 2020-02-23 NOTE — Unmapped (Signed)
PULMONARY PROGRESS NOTE      Patient: Victoria Copeland(August 06, 1950)  Reason for admission: Bronchiectasis without complication (CMS-HCC)     Assessment and Recommendations:      Principal Problem:    Bronchiectasis without complication (CMS-HCC)  Active Problems:    Pseudomonas aeruginosa colonization  Resolved Problems:    * No resolved hospital problems. *      - Bronchiectasis with acute exacerbation  - Pseudomonas infection/tracheobronchitis    ?? IV ceftazidime D2/10  ?? Inhaled Tobramycin D2  ?? Prednisone taper over 20 days as per plan  ?? Airway clearance- 3% HTS, albuterol and aerobika  ?? Follow up new sputum cx if produced    Please page [phone] with any questions.      Carmelia Roller, MD    Subjective:      Interval History (02/23/20)  Patient says she feels about the same, cough not very productive yet.        Objective:      Physical Exam:  Vitals:    02/22/20 1954 02/22/20 2000 02/22/20 2023 02/23/20 0521   BP:   141/78 118/58   Pulse: 82  74 56   Resp: 18  18 18    Temp:  35.8 ??C (96.4 ??F) 35.8 ??C (96.4 ??F) 35.9 ??C (96.6 ??F)   TempSrc:   Oral Oral   SpO2: 96%  96% 96%   Weight:       Height:           General appearance: mild respiratory distress, appears stated age  HEENT: normal external ears, PERRL, conjunctiva clear, EOMI, NP clear, good dentition  Neck: supple, FROM  Resp: No crackles, + accessory muscle use, symmetric chest expansion, no dullness on percussion, few wheezes  CV: RRR, no m/g/r, No JVD, no peripheral edema  GI: soft NT ND, + BS  Musculoskeletal: joints normal without swelling, normal strength and muscle tone  Skin: no visible rashes or lesions  Lymph: no cervical, supraclavicular or submandibular LAD  Neuro: A and O, CN 2-12 grossly intact, motor and sensation intact; gait not assessed      Malnutrition Assessment using AND/ASPEN Clinical Characteristics:            Patient Lines/Drains/Airways Status     Active Active Lines, Drains, & Airways     Name Placement date Placement time Site Days Peripheral IV 02/21/20 Anterior;Left Wrist 02/21/20  1500  Wrist  1              Patient Lines/Drains/Airways Status     Active Wounds     None                   Diagnostic Review:   All labs and images were personally reviewed.    cxr 02/21/20: micronodules in RUL, no consolidation

## 2020-02-23 NOTE — Unmapped (Signed)
Patient is alert and oriented x 4, self care. Vss, afebrile, no s/s of infection. No c/o pain. Continues on IV antibiotics. Will continues with the plan of care.   Problem: Adult Inpatient Plan of Care  Goal: Plan of Care Review  Outcome: Progressing  Goal: Patient-Specific Goal (Individualized)  Outcome: Progressing  Goal: Absence of Hospital-Acquired Illness or Injury  Outcome: Progressing  Intervention: Identify and Manage Fall Risk  Recent Flowsheet Documentation  Taken 02/22/2020 2000 by Mayo Ao, RN  Safety Interventions: fall reduction program maintained  Intervention: Prevent and Manage VTE (Venous Thromboembolism) Risk  Recent Flowsheet Documentation  Taken 02/22/2020 2000 by Andrey Farmer Marlow-Freeman, RN  Activity Management: activity adjusted per tolerance  Goal: Optimal Comfort and Wellbeing  Outcome: Progressing  Goal: Readiness for Transition of Care  Outcome: Progressing  Goal: Rounds/Family Conference  Outcome: Progressing     Problem: Infection  Goal: Absence of Infection Signs and Symptoms  Outcome: Progressing  Intervention: Prevent or Manage Infection  Recent Flowsheet Documentation  Taken 02/22/2020 2000 by Andrey Farmer Marlow-Freeman, RN  Isolation Precautions: contact precautions maintained

## 2020-02-23 NOTE — Unmapped (Signed)
Hospital Medicine Daily Progress Note    Assessment/Plan:    Principal Problem:    Bronchiectasis without complication (CMS-HCC)  Active Problems:    Pseudomonas aeruginosa colonization  Resolved Problems:    * No resolved hospital problems. *                 Victoria Copeland is a 70 y.o. female with a h/o bronchiectasis, Hypothyroidism, MS, Migraines that presented to [facility] with Bronchiectasis without complication (CMS-HCC).      Bronchiectasis exacerbation.  Bronchiectasis of unclear etiology (no hypogammaglobulinemia, NL IgE to date, no significant eosinophilia).  Undergoing outpt eval for CF (awaiting results).  Previous sputum cultures w Actinomyces species, P aeruginosa, Penicillium species.  No improvement on levofloxacin (12/7-12/21).  FEV1 43%, down from 62% 12/2019.  -Continue ceftaz (started 1/1 - 1/10) w intent of this admission to treat P aeruginosa w additional treatment for Actinomyces if no improvement.  -Continue tobra nebs through 1/4 as previously planned.  -Continue airway clearance w 3% saline nebs, albuterol, Aerobika.  -Continue prednisone taper started 1/1 (40mg  x5d, 30mg  x 5d, 20mg  x 5d, 10mg  x 5d) for peripheral eosinophilia per Pulm.  -Continue PPI for now while on prednisone but does not need this long-term.  -F/U IgE level.  -Consulting Pulm; appreciate their assistance.  Will defer to Pulm decision to obtain inpt PFTs or repeat chest CT imaging.  ??  Hypothyroid.  Stable.  -Continue L-thyroxine.  ??  MS.  Controlled off meds.  ??  Migraines, insomnia, h/o palpitations.  -Continue amitriptyline, propranolol.  ??  DVT prophylaxis.  -DC enoxaparin.  -Ambulation.  ??  Dispo.  Pending above.  ??  Please page the [facility]) pager at [phone] with questions.  ??  ___________________________________________________________________    Subjective:  Pt reports feeling better for the first time. States air movement feels better. Reports cough is starting to produce green sputum which is new and feels this is helping clear out her airways.    Labs/Studies:  Labs and Studies from the last 24hrs per EMR and Reviewed    Objective:  Temp:  [35.8 ??C (96.4 ??F)-35.9 ??C (96.6 ??F)] 35.9 ??C (96.6 ??F)  Heart Rate:  [55-82] 55  Resp:  [18] 18  BP: (118-141)/(58-78) 137/75  SpO2:  [96 %-99 %] 97 %    GEN: NAD, laying in bed  EYES: EOMI  ENT: MMM  CV: RRR  PULM: Crackles in bases, moderate air movement B. No wheezing.  ABD: soft, NT/ND, +BS  EXT: No edema

## 2020-02-24 LAB — CBC
HEMATOCRIT: 35.4 % (ref 35.0–44.0)
HEMOGLOBIN: 11.5 g/dL — ABNORMAL LOW (ref 12.0–15.5)
MEAN CORPUSCULAR HEMOGLOBIN CONC: 32.4 g/dL (ref 30.0–36.0)
MEAN CORPUSCULAR HEMOGLOBIN: 27.4 pg (ref 26.0–34.0)
MEAN CORPUSCULAR VOLUME: 84.5 fL (ref 82.0–98.0)
MEAN PLATELET VOLUME: 8.6 fL (ref 7.0–10.0)
PLATELET COUNT: 288 10*9/L (ref 150–450)
RED BLOOD CELL COUNT: 4.19 10*12/L (ref 3.90–5.03)
RED CELL DISTRIBUTION WIDTH: 13.3 % (ref 12.0–15.0)
WBC ADJUSTED: 7.4 10*9/L (ref 3.5–10.5)

## 2020-02-24 LAB — BASIC METABOLIC PANEL
ANION GAP: 7 mmol/L (ref 5–14)
BLOOD UREA NITROGEN: 13 mg/dL (ref 9–23)
BUN / CREAT RATIO: 17
CALCIUM: 9.7 mg/dL (ref 8.7–10.4)
CHLORIDE: 104 mmol/L (ref 98–107)
CO2: 28.6 mmol/L (ref 20.0–31.0)
CREATININE: 0.77 mg/dL
EGFR CKD-EPI AA FEMALE: >90 mL/min/{1.73_m2} (ref >=60)
EGFR CKD-EPI NON-AA FEMALE: 80 mL/min/{1.73_m2} (ref >=60)
GLUCOSE RANDOM: 78 mg/dL (ref 70–179)
POTASSIUM: 3.9 mmol/L (ref 3.4–4.5)
SODIUM: 140 mmol/L (ref 135–145)

## 2020-02-24 MED ADMIN — albuterol 2.5 mg /3 mL (0.083 %) nebulizer solution 2.5 mg: 2.5 mg | RESPIRATORY_TRACT | @ 20:00:00 | Stop: 2020-02-25

## 2020-02-24 MED ADMIN — predniSONE (DELTASONE) tablet 40 mg: 40 mg | ORAL | @ 14:00:00 | Stop: 2020-02-25

## 2020-02-24 MED ADMIN — amitriptyline (ELAVIL) tablet 10 mg: 10 mg | ORAL | @ 02:00:00 | Stop: 2020-02-25

## 2020-02-24 MED ADMIN — sodium chloride 3 % nebulizer solution 4 mL: 4 mL | RESPIRATORY_TRACT | @ 02:00:00 | Stop: 2020-02-25

## 2020-02-24 MED ADMIN — cefTAZidime (FORTAZ) 2 g in sodium chloride 0.9 % (NS) 100 mL IVPB-MBP: 2 g | INTRAVENOUS | @ 11:00:00 | Stop: 2020-03-01

## 2020-02-24 MED ADMIN — cefTAZidime (FORTAZ) 2 g in sodium chloride 0.9 % (NS) 100 mL IVPB-MBP: 2 g | INTRAVENOUS | @ 02:00:00 | Stop: 2020-03-01

## 2020-02-24 MED ADMIN — tobramycin (PF) (TOBI) 300 mg/5 mL nebulizer solution 300 mg: 300 mg | RESPIRATORY_TRACT | @ 13:00:00 | Stop: 2020-02-25

## 2020-02-24 MED ADMIN — levothyroxine (SYNTHROID) tablet 50 mcg: 50 ug | ORAL | @ 11:00:00 | Stop: 2020-02-25

## 2020-02-24 MED ADMIN — albuterol 2.5 mg /3 mL (0.083 %) nebulizer solution 2.5 mg: 2.5 mg | RESPIRATORY_TRACT | @ 02:00:00 | Stop: 2020-02-25

## 2020-02-24 MED ADMIN — sodium chloride 3 % nebulizer solution 4 mL: 4 mL | RESPIRATORY_TRACT | @ 13:00:00 | Stop: 2020-02-25

## 2020-02-24 MED ADMIN — propranoloL (INDERAL) tablet 40 mg: 40 mg | ORAL | @ 02:00:00 | Stop: 2020-02-25

## 2020-02-24 MED ADMIN — pantoprazole (PROTONIX) EC tablet 20 mg: 20 mg | ORAL | @ 14:00:00 | Stop: 2020-03-02

## 2020-02-24 MED ADMIN — albuterol 2.5 mg /3 mL (0.083 %) nebulizer solution 2.5 mg: 2.5 mg | RESPIRATORY_TRACT | @ 13:00:00 | Stop: 2020-02-25

## 2020-02-24 MED ADMIN — sodium chloride 3 % nebulizer solution 4 mL: 4 mL | RESPIRATORY_TRACT | @ 20:00:00 | Stop: 2020-02-25

## 2020-02-24 MED ADMIN — tobramycin (PF) (TOBI) 300 mg/5 mL nebulizer solution 300 mg: 300 mg | RESPIRATORY_TRACT | @ 02:00:00 | Stop: 2020-02-25

## 2020-02-24 MED ADMIN — cefTAZidime (FORTAZ) 2 g in sodium chloride 0.9 % (NS) 100 mL IVPB-MBP: 2 g | INTRAVENOUS | @ 19:00:00 | Stop: 2020-03-01

## 2020-02-24 NOTE — Unmapped (Signed)
Patient Aox4. Patient with no complaints. Patient did take a shower with help from significant other. Receiving IV antibiotic and tolerating well. No sputum sample yet at this time. Call bell within each and notified to call for assistance.     Problem: Infection  Goal: Absence of Infection Signs and Symptoms  Outcome: Ongoing - Unchanged  Intervention: Prevent or Manage Infection  Recent Flowsheet Documentation  Taken 02/23/2020 1800 by Nadine Counts, RN  Isolation Precautions: contact precautions maintained  Taken 02/23/2020 1600 by Nadine Counts, RN  Isolation Precautions: contact precautions maintained  Taken 02/23/2020 1400 by Nadine Counts, RN  Isolation Precautions: contact precautions maintained  Taken 02/23/2020 1200 by Nadine Counts, RN  Isolation Precautions: contact precautions maintained  Taken 02/23/2020 1000 by Nadine Counts, RN  Isolation Precautions: contact precautions maintained  Taken 02/23/2020 1610 by Nadine Counts, RN  Isolation Precautions: contact precautions maintained

## 2020-02-24 NOTE — Unmapped (Addendum)
Adult Nutrition Assessment Note    Visit Type: RN Consult  Reason for Visit: Have you gained or lost 10 pounds in the past 3 months?,Per Admission Nutrition Screen (Adult),Have you had a decrease in food intake or appetite?      HPI & PMH:  Victoria Copeland is a 70 y.o. female with PMHx of bronchiectasis, hypothyroidism, and MS that presented to [facility] with acute Bronchiectasis without complication (CMS-HCC).    Anthropometric Data:  Height: 157.5 cm (5' 2)   Admission weight: 56 kg (123 lb 6.4 oz)  Last recorded weight: 56 kg (123 lb 6.4 oz)  IBW: 49.97 kg  Percent IBW: 112.02 %  BMI: Body mass index is 22.57 kg/m??.   Usual Body Weight: 127 lb    Weight history prior to admission: 6 lb loss x 2 months (4.7%) not significant;  7 lb loss (5.4%) x 6 months (not significant)   Wt Readings from Last 10 Encounters:   02/21/20 56 kg (123 lb 6.4 oz)   02/18/20 56 kg (123 lb 6.4 oz)   01/05/20 58.5 kg (129 lb)   09/17/19 59 kg (130 lb)   07/10/19 59.7 kg (131 lb 9.6 oz)   05/01/19 59 kg (130 lb)   02/12/19 58.1 kg (128 lb)        Weight changes this admission:   Last 5 Recorded Weights    02/21/20 1515   Weight: 56 kg (123 lb 6.4 oz)        Nutrition Focused Physical Exam:  Nutrition Focused Physical Exam:  Fat Areas Examined  Orbital: No loss  Upper Arm: No loss  Thoracic: No loss      Muscle Areas Examined  Temple: No loss  Clavicle: No loss  Acromion: No loss  Scapular: No loss  Dorsal Hand: No loss  Patellar: No loss  Anterior Thigh: No loss  Posterior Calf: No loss              Nutrition Evaluation  Overall Impressions: No fat loss;No muscle loss (02/24/20 1209)  Nutrition Designation: Normal weight (BMI 18.50 - 24.99 kg/m2) (02/24/20 1209)      NUTRITIONALLY RELEVANT DATA     Medications:   Nutritionally pertinent medications reviewed and evaluated for potential food and/or medication interactions and include synthroid, miralax, prednisone, phenergan    Labs:   Nutritionally pertinent labs reviewed.     Nutrition History: February 24, 2020: Prior to admission: Patient reports a fair PO intake PTA. She reports current intake is good for her. Reported a sandwich + chips + coke for lunch which is what she typically eats. Still awaiting tray but feels motivated to eat. She denies any N/V or chewing/swallowing impairments at this time. Reports eating yogurt for gut health due to antibiotic use. Stated UBW is 127lb which she reports last weighing over the past year.      Allergies, Intolerances, Sensitivities, and/or Cultural/Religious Dietary Restrictions: none identified per chart review at this time     Current Nutrition:  Oral intake        Nutrition Orders   (From admission, onward)             Start     Ordered    02/21/20 1519  Nutrition Therapy Regular/House  Effective now        Question:  Nutrition Therapy:  Answer:  Regular/House    02/21/20 1520                   Nutritional  Needs:   Healthy balance of carbohydrate, protein, and fat.       Malnutrition Assessment using AND/ASPEN Clinical Characteristics:    Patient does not meet AND/ASPEN criteria for malnutrition at this time (02/24/20 1209)       GOALS and EVALUATION     ??? Patient to consume 75% or greater of po intake via combination of meals, snacks, and/or oral supplements within 3-5 days.  - New   ??? Prevent any further unintended weight loss this admission. - New    Motivation, Barriers, and Compliance:  Evaluation of motivation, barriers, and compliance completed. No concerns identified at this time.     NUTRITION ASSESSMENT     ??? Current nutrition therapy is appropriate and progressing toward meeting meeting nutritional needs at this time.        Discharge Planning:   Monitor via CAPP rounds for any discharge planning needs.      NUTRITION INTERVENTIONS and RECOMMENDATION     1. Continue with current diet: Regular   2. Weekly weights   3. Record %PO intakes in EMR    Follow-Up Parameters:   1-2 times per week (and more frequent as indicated)    Thank you for allowing me to participate in the care of this patient. Please feel free to page with any questions or concerns.    Theadora Rama, MS, RD, LDN, CDCES  Pager#: [phone]

## 2020-02-24 NOTE — Unmapped (Signed)
PULMONARY PROGRESS NOTE      Patient: Victoria Copeland(01-29-1951)  Reason for admission: Bronchiectasis without complication (CMS-HCC)     Assessment and Recommendations:      Principal Problem:    Bronchiectasis without complication (CMS-HCC)  Active Problems:    Pseudomonas aeruginosa colonization  Resolved Problems:    * No resolved hospital problems. *      - Bronchiectasis with acute exacerbation  - Pseudomonas infection/tracheobronchitis    ?? IV ceftazidime D3/10  ?? Inhaled Tobramycin D3/3 - to complete month course  ?? Prednisone taper over 20 days as per plan  ?? Airway clearance- 3% HTS, albuterol and aerobika  ?? Follow up new sputum cx if produced  ?? Plan for PFTs on Thursday    Please page [phone] with any questions.      Carmelia Roller, MD    Subjective:      Interval History (02/24/20)  Patient says she feels a little better. She has more energy.        Objective:      Physical Exam: on RA  Vitals:    02/23/20 2043 02/24/20 0600 02/24/20 0753 02/24/20 0834   BP: 112/85 134/63  160/72   Pulse: 66 58 62 57   Resp: 18 18 18 20    Temp: 35.9 ??C (96.6 ??F) 35.9 ??C (96.6 ??F)     TempSrc: Oral Oral     SpO2: 98% 97% 99% 95%   Weight:       Height:           General appearance: No respiratory distress, appears stated age  HEENT: normal external ears, PERRL, conjunctiva clear, EOMI, NP clear, good dentition  Neck: supple, FROM  Resp: No crackles, no accessory muscle use, symmetric chest expansion, no dullness on percussion, rare wheezes  CV: RRR, no m/g/r, No JVD, no peripheral edema  GI: soft NT ND, + BS  Musculoskeletal: joints normal without swelling, normal strength and muscle tone  Skin: no visible rashes or lesions  Neuro: A and O, CN 2-12 grossly intact, motor and sensation intact; gait not assessed      Malnutrition Assessment using AND/ASPEN Clinical Characteristics:            Patient Lines/Drains/Airways Status     Active Active Lines, Drains, & Airways     Name Placement date Placement time Site Days Peripheral IV 02/21/20 Anterior;Left Wrist 02/21/20  1500  Wrist  2              Patient Lines/Drains/Airways Status     Active Wounds     None                   Diagnostic Review:   All labs and images were personally reviewed.    cxr 02/21/20: micronodules in RUL, no consolidation

## 2020-02-24 NOTE — Unmapped (Signed)
Hospital Medicine Daily Progress Note    Assessment/Plan:    Principal Problem:    Bronchiectasis without complication (CMS-HCC)  Active Problems:    Pseudomonas aeruginosa colonization  Resolved Problems:    * No resolved hospital problems. *   Malnutrition Evaluation as performed by RD, LDN: Patient does not meet AND/ASPEN criteria for malnutrition at this time (02/24/20 1209)             Victoria Copeland is a 70 y.o. female with a h/o bronchiectasis, Hypothyroidism, MS, Migraines that presented to [facility] with Bronchiectasis without complication (CMS-HCC).      Bronchiectasis exacerbation.  Bronchiectasis of unclear etiology (no hypogammaglobulinemia, NL IgE to date, no significant eosinophilia).  Undergoing outpt eval for CF (awaiting results).  Previous sputum cultures w Actinomyces species, P aeruginosa, Penicillium species.  No improvement on levofloxacin (12/7-12/21).  FEV1 43%, down from 62% 12/2019.  -Continue ceftaz (started 1/1 - 1/10) w intent of this admission to treat P aeruginosa w additional treatment for Actinomyces if no improvement.  -Continue tobra nebs through 1/4 as previously planned.  -Continue airway clearance w 3% saline nebs, albuterol, Aerobika.  -Continue prednisone taper started 1/1 (40mg  x5d, 30mg  x 5d, 20mg  x 5d, 10mg  x 5d) for peripheral eosinophilia per Pulm.  -Continue PPI for now while on prednisone but does not need this long-term.  -F/U IgE level.  -Consulting Pulm; appreciate their assistance.  PFTs Thursday  -ESR tomorrow  -Awaiting possible sputum cx  ??  Hypothyroid.  Stable.  -Continue L-thyroxine.  ??  MS.  Controlled off meds.  ??  Migraines, insomnia, h/o palpitations.  -Continue amitriptyline, propranolol.  ??  DVT prophylaxis.  -DC enoxaparin.  -Ambulation.  ??  Dispo.  Pending above.  ??  Please page the [facility]) pager at [phone] with questions.  ??  ___________________________________________________________________    Subjective:  Pt reports feeling well. No big changes from yesterday. States she tries to stay active as best she can    Labs/Studies:  Labs and Studies from the last 24hrs per EMR and Reviewed    Objective:  Temp:  [35.9 ??C (96.6 ??F)] 35.9 ??C (96.6 ??F)  Heart Rate:  [57-72] 72  Resp:  [16-20] 18  BP: (112-160)/(63-85) 160/72  SpO2:  [95 %-99 %] 98 %    GEN: NAD, laying in bed  EYES: EOMI  ENT: MMM  CV: RRR  PULM: Crackles in L lung base only, moderate air movement B. No wheezing.  ABD: soft, NT/ND, +BS  EXT: No edema

## 2020-02-24 NOTE — Unmapped (Signed)
Patient care transferred to this RN at 0700. Patient fully alert and oriented with no complaints of pain. Patient up ad lib. Patient knows to give a sputum sample if/when she can. There were so significant medical changes with the patient during my shift.     All of the patient's medications were administered as ordered (I held the Propanolol), the patient's bed remained locked and low and the call-bell was within reach. All of the patient needs were met during my shift and the patient remained free from any falls.       Problem: Adult Inpatient Plan of Care  Goal: Plan of Care Review  Outcome: Progressing  Goal: Patient-Specific Goal (Individualized)  Outcome: Progressing  Goal: Absence of Hospital-Acquired Illness or Injury  Outcome: Progressing  Intervention: Identify and Manage Fall Risk  Recent Flowsheet Documentation  Taken 02/24/2020 1200 by Heide Scales, RN  Safety Interventions:   fall reduction program maintained   low bed   nonskid shoes/slippers when out of bed   room near unit station  Taken 02/24/2020 1000 by Heide Scales, RN  Safety Interventions:   fall reduction program maintained   low bed   nonskid shoes/slippers when out of bed   room near unit station  Taken 02/24/2020 0800 by Heide Scales, RN  Safety Interventions:   fall reduction program maintained   low bed   nonskid shoes/slippers when out of bed   room near unit station  Intervention: Prevent and Manage VTE (Venous Thromboembolism) Risk  Recent Flowsheet Documentation  Taken 02/24/2020 1200 by Heide Scales, RN  Activity Management: activity encouraged  Taken 02/24/2020 1000 by Heide Scales, RN  Activity Management: activity encouraged  Taken 02/24/2020 0800 by Heide Scales, RN  Activity Management: activity encouraged  Goal: Optimal Comfort and Wellbeing  Outcome: Progressing  Goal: Readiness for Transition of Care  Outcome: Progressing  Goal: Rounds/Family Conference  Outcome: Progressing     Problem: Infection  Goal: Absence of Infection Signs and Symptoms  Outcome: Progressing  Intervention: Prevent or Manage Infection  Recent Flowsheet Documentation  Taken 02/24/2020 1200 by Heide Scales, RN  Isolation Precautions: contact precautions maintained  Taken 02/24/2020 1000 by Heide Scales, RN  Isolation Precautions: contact precautions maintained  Taken 02/24/2020 0800 by Heide Scales, RN  Isolation Precautions: contact precautions maintained

## 2020-02-24 NOTE — Unmapped (Signed)
POC discussed with patient. No falls noted during my shift thus far and I will continue to monitor patient. Independent with ADL's.      Problem: Adult Inpatient Plan of Care  Goal: Plan of Care Review  Outcome: Progressing  Goal: Patient-Specific Goal (Individualized)  Outcome: Progressing  Goal: Absence of Hospital-Acquired Illness or Injury  Outcome: Progressing  Intervention: Identify and Manage Fall Risk  Recent Flowsheet Documentation  Taken 02/24/2020 0200 by Wynn Maudlin, RN  Safety Interventions:   fall reduction program maintained   low bed   nonskid shoes/slippers when out of bed   isolation precautions  Taken 02/24/2020 0000 by Wynn Maudlin, RN  Safety Interventions:   fall reduction program maintained   low bed   nonskid shoes/slippers when out of bed   isolation precautions  Taken 02/23/2020 2200 by Wynn Maudlin, RN  Safety Interventions:   fall reduction program maintained   low bed   nonskid shoes/slippers when out of bed   isolation precautions  Taken 02/23/2020 2000 by Wynn Maudlin, RN  Safety Interventions:   fall reduction program maintained   isolation precautions   low bed   nonskid shoes/slippers when out of bed  Intervention: Prevent and Manage VTE (Venous Thromboembolism) Risk  Recent Flowsheet Documentation  Taken 02/23/2020 2200 by Wynn Maudlin, RN  Activity Management: activity adjusted per tolerance  Taken 02/23/2020 2000 by Wynn Maudlin, RN  Activity Management: activity adjusted per tolerance  Goal: Optimal Comfort and Wellbeing  Outcome: Progressing  Goal: Readiness for Transition of Care  Outcome: Progressing  Goal: Rounds/Family Conference  Outcome: Progressing     Problem: Infection  Goal: Absence of Infection Signs and Symptoms  Outcome: Progressing  Intervention: Prevent or Manage Infection  Recent Flowsheet Documentation  Taken 02/24/2020 0200 by Wynn Maudlin, RN  Isolation Precautions: contact precautions maintained  Taken 02/24/2020 0000 by Wynn Maudlin, RN  Isolation Precautions: contact precautions maintained  Taken 02/23/2020 2200 by Wynn Maudlin, RN  Isolation Precautions: contact precautions maintained  Taken 02/23/2020 2000 by Wynn Maudlin, RN  Isolation Precautions: contact precautions maintained

## 2020-02-25 LAB — BASIC METABOLIC PANEL
ANION GAP: 4 mmol/L — ABNORMAL LOW (ref 5–14)
BLOOD UREA NITROGEN: 9 mg/dL (ref 9–23)
BUN / CREAT RATIO: 12
CALCIUM: 9.6 mg/dL (ref 8.7–10.4)
CHLORIDE: 105 mmol/L (ref 98–107)
CO2: 28.9 mmol/L (ref 20.0–31.0)
CREATININE: 0.74 mg/dL
EGFR CKD-EPI AA FEMALE: >90 mL/min/{1.73_m2} (ref >=60)
EGFR CKD-EPI NON-AA FEMALE: 84 mL/min/{1.73_m2} (ref >=60)
GLUCOSE RANDOM: 82 mg/dL (ref 70–179)
POTASSIUM: 3.9 mmol/L (ref 3.4–4.5)
SODIUM: 138 mmol/L (ref 135–145)

## 2020-02-25 LAB — SEDIMENTATION RATE, MANUAL: ERYTHROCYTE SEDIMENTATION RATE: 30 mm/h (ref 0–30)

## 2020-02-25 MED ORDER — PREDNISONE 5 MG TABLET
ORAL_TABLET | Freq: Every day | ORAL | 0 refills | 7 days | Status: CP
Start: 2020-02-25 — End: 2020-03-01
  Filled 2020-02-25: qty 15, 7d supply, fill #0

## 2020-02-25 MED ORDER — PANTOPRAZOLE 20 MG TABLET,DELAYED RELEASE
ORAL_TABLET | Freq: Every day | ORAL | 0 refills | 5 days | Status: CP
Start: 2020-02-25 — End: 2020-02-29
  Filled 2020-02-25: qty 5, 5d supply, fill #0

## 2020-02-25 MED ORDER — ONDANSETRON 4 MG DISINTEGRATING TABLET
ORAL_TABLET | Freq: Four times a day (QID) | ORAL | 0 refills | 5 days | Status: CP | PRN
Start: 2020-02-25 — End: ?
  Filled 2020-02-25: qty 20, 5d supply, fill #0

## 2020-02-25 MED ADMIN — tobramycin (PF) (TOBI) 300 mg/5 mL nebulizer solution 300 mg: 300 mg | RESPIRATORY_TRACT | @ 01:00:00 | Stop: 2020-02-25

## 2020-02-25 MED ADMIN — cefTAZidime (FORTAZ) 2 g in sodium chloride 0.9 % (NS) 100 mL IVPB-MBP: 2 g | INTRAVENOUS | @ 11:00:00 | Stop: 2020-03-01

## 2020-02-25 MED ADMIN — albuterol 2.5 mg /3 mL (0.083 %) nebulizer solution 2.5 mg: 2.5 mg | RESPIRATORY_TRACT | @ 18:00:00 | Stop: 2020-02-25

## 2020-02-25 MED ADMIN — tobramycin (PF) (TOBI) 300 mg/5 mL nebulizer solution 300 mg: 300 mg | RESPIRATORY_TRACT | @ 13:00:00 | Stop: 2020-02-25

## 2020-02-25 MED ADMIN — albuterol 2.5 mg /3 mL (0.083 %) nebulizer solution 2.5 mg: 2.5 mg | RESPIRATORY_TRACT | @ 13:00:00 | Stop: 2020-02-25

## 2020-02-25 MED ADMIN — sodium chloride 3 % nebulizer solution 4 mL: 4 mL | RESPIRATORY_TRACT | @ 13:00:00 | Stop: 2020-02-25

## 2020-02-25 MED ADMIN — sodium chloride 3 % nebulizer solution 4 mL: 4 mL | RESPIRATORY_TRACT | @ 01:00:00 | Stop: 2020-02-25

## 2020-02-25 MED ADMIN — cefTAZidime (FORTAZ) 2 g in sodium chloride 0.9 % (NS) 100 mL IVPB-MBP: 2 g | INTRAVENOUS | @ 18:00:00 | Stop: 2020-03-01

## 2020-02-25 MED ADMIN — amitriptyline (ELAVIL) tablet 10 mg: 10 mg | ORAL | @ 02:00:00 | Stop: 2020-02-25

## 2020-02-25 MED ADMIN — albuterol 2.5 mg /3 mL (0.083 %) nebulizer solution 2.5 mg: 2.5 mg | RESPIRATORY_TRACT | @ 01:00:00 | Stop: 2020-02-25

## 2020-02-25 MED ADMIN — predniSONE (DELTASONE) tablet 40 mg: 40 mg | ORAL | @ 14:00:00 | Stop: 2020-02-25

## 2020-02-25 MED ADMIN — pantoprazole (PROTONIX) EC tablet 20 mg: 20 mg | ORAL | @ 14:00:00 | Stop: 2020-03-02

## 2020-02-25 MED ADMIN — cefTAZidime (FORTAZ) 2 g in sodium chloride 0.9 % (NS) 100 mL IVPB-MBP: 2 g | INTRAVENOUS | @ 02:00:00 | Stop: 2020-03-01

## 2020-02-25 MED ADMIN — propranoloL (INDERAL) tablet 40 mg: 40 mg | ORAL | @ 14:00:00 | Stop: 2020-02-25

## 2020-02-25 MED ADMIN — sodium chloride 3 % nebulizer solution 4 mL: 4 mL | RESPIRATORY_TRACT | @ 18:00:00 | Stop: 2020-02-25

## 2020-02-25 MED ADMIN — levothyroxine (SYNTHROID) tablet 50 mcg: 50 ug | ORAL | @ 11:00:00 | Stop: 2020-02-25

## 2020-02-25 MED ADMIN — propranoloL (INDERAL) tablet 40 mg: 40 mg | ORAL | @ 02:00:00 | Stop: 2020-02-25

## 2020-02-25 MED FILL — PANTOPRAZOLE 20 MG TABLET,DELAYED RELEASE: 5 days supply | Qty: 5 | Fill #0 | Status: AC

## 2020-02-25 MED FILL — PREDNISONE 5 MG TABLET: 7 days supply | Qty: 15 | Fill #0 | Status: AC

## 2020-02-25 MED FILL — ONDANSETRON 4 MG DISINTEGRATING TABLET: 5 days supply | Qty: 20 | Fill #0 | Status: AC

## 2020-02-25 NOTE — Unmapped (Addendum)
Victoria Copeland is a 70 y.o. female with a h/o bronchiectasis, Hypothyroidism, MS, Migraines that presented to [facility] with Bronchiectasis without complication.        Bronchiectasis exacerbation.  Bronchiectasis of unclear etiology (no hypogammaglobulinemia, NL IgE to date, no significant eosinophilia).  Undergoing outpt eval for CF (awaiting results).  Previous sputum cultures w Actinomyces species, P aeruginosa, Penicillium species.  No improvement on levofloxacin (12/7-12/21).  FEV1 43%, down from 62% 12/2019. ESR 30 down from 78 12/29. Pt treated with ceftaz (started 1/1 - 1/10) w/ intent of this admission to treat P aeruginosa w additional treatment for Actinomyces if no improvement. She continued tobra nebs through 1/4 for a month of treatment as previously planned. Provided airway clearance w 3% saline nebs, albuterol, Aerobika.  She was initiated on prednisone taper started 1/1 (40mg  x5d, 30mg  x 5d, 20mg  x 5d, 10mg  x 5d) for peripheral eosinophilia per Pulm and given PPI for now while on prednisone but does not need this long-term.  Her IgE*** PFTs*** Inpt Sputum Cx***       Hypothyroid.  Stable. Continued L-thyroxine.     MS.  Controlled off meds.     Migraines, insomnia, h/o palpitations. Continued amitriptyline, propranolol.

## 2020-02-25 NOTE — Unmapped (Signed)
PULMONARY PROGRESS NOTE      Patient: Victoria Copeland([birth year])  Reason for admission: Bronchiectasis without complication (CMS-HCC)     Assessment and Recommendations:      Principal Problem:    Bronchiectasis without complication (CMS-HCC)  Active Problems:    Pseudomonas aeruginosa colonization  Resolved Problems:    * No resolved hospital problems. *      - Bronchiectasis with acute exacerbation  - Pseudomonas infection/tracheobronchitis    ?? IV ceftazidime D4/10  ?? Inhaled Tobramycin - completed month course  ?? Prednisone taper over 20 days as per plan  ?? Airway clearance- 3% HTS, albuterol and aerobika  ?? Follow up new sputum cx if produced  ?? Plan for PFTs on Thursday  ?? Considering [facility] home program    Please page [phone] with any questions.      Carmelia Roller, MD    Subjective:      Interval History (02/25/20)  Patient feels a little worse today with nasal congestion and more chest tightness.      Objective:      Physical Exam: on RA  Vitals:    02/24/20 1538 02/24/20 2000 02/24/20 2011 02/25/20 0628   BP: 138/75  115/73 139/66   Pulse: 80 70 77 63   Resp: 18 16 16 16    Temp: 35.7 ??C (96.2 ??F)  36 ??C (96.8 ??F) 36.3 ??C (97.3 ??F)   TempSrc: Oral  Oral Oral   SpO2: 96% 97% 100% 97%   Weight:       Height:           General appearance: No respiratory distress, appears stated age  HEENT: normal external ears, PERRL, conjunctiva clear, EOMI, NP clear, good dentition  Neck: supple, FROM  Resp: No crackles, no accessory muscle use, symmetric chest expansion, no dullness on percussion, scattered bilateral wheezes  CV: RRR, no m/g/r, No JVD, no peripheral edema  GI: soft NT ND, + BS  Musculoskeletal: joints normal without swelling, normal strength and muscle tone  Skin: no visible rashes or lesions  Neuro: A and O, CN 2-12 grossly intact, motor and sensation intact; gait not assessed      Malnutrition Assessment using AND/ASPEN Clinical Characteristics:    Patient does not meet AND/ASPEN criteria for malnutrition at this time (02/24/20 1209)       Patient Lines/Drains/Airways Status     Active Active Lines, Drains, & Airways     Name Placement date Placement time Site Days    Peripheral IV 02/21/20 Anterior;Left Wrist 02/21/20  1500  Wrist  3              Patient Lines/Drains/Airways Status     Active Wounds     None                   Diagnostic Review:   All labs and images were personally reviewed.    cxr 02/21/20: micronodules in RUL, no consolidation

## 2020-02-25 NOTE — Unmapped (Signed)
.  hmnHospital Medicine Daily Progress Note    Assessment/Plan:    Principal Problem:    Bronchiectasis without complication (CMS-HCC)  Active Problems:    Pseudomonas aeruginosa colonization  Resolved Problems:    * No resolved hospital problems. *   Malnutrition Evaluation as performed by RD, LDN: Patient does not meet AND/ASPEN criteria for malnutrition at this time (02/24/20 1209)             Victoria Copeland is a 70 y.o. female with a h/o bronchiectasis, Hypothyroidism, MS, Migraines that presented to [facility] with Bronchiectasis without complication (CMS-HCC).      Bronchiectasis exacerbation.  Bronchiectasis of unclear etiology (no hypogammaglobulinemia, NL IgE to date, no significant eosinophilia).  Undergoing outpt eval for CF (awaiting results).  Previous sputum cultures w Actinomyces species, P aeruginosa, Penicillium species.  No improvement on levofloxacin (12/7-12/21).  FEV1 43%, down from 62% 12/2019. ESR 30 down from 78 12/29  -Continue ceftaz (started 1/1 - 1/10) w intent of this admission to treat P aeruginosa w additional treatment for Actinomyces if no improvement.  -Continued tobra nebs through 1/4 as previously planned.  -Continue airway clearance w 3% saline nebs, albuterol, Aerobika.  -Continue prednisone taper started 1/1 (40mg  x5d, 30mg  x 5d, 20mg  x 5d, 10mg  x 5d) for peripheral eosinophilia per Pulm.  -Continue PPI for now while on prednisone but does not need this long-term.  -F/U IgE level.  -Consulting Pulm; appreciate their assistance.  PFTs Thursday  -Awaiting possible sputum cx  ??  Hypothyroid.  Stable.  -Continue L-thyroxine.  ??  MS.  Controlled off meds.  ??  Migraines, insomnia, h/o palpitations.  -Continue amitriptyline, propranolol.  ??  DVT prophylaxis.  -DC enoxaparin.  -Ambulation.  ??  Dispo.  Pending above.  ??  Please page the [facility]) pager at [phone] with questions.  ??  ___________________________________________________________________    Subjective:  Pt reports feeling well. No changes. States would like her budesonide nasal rinses from home. Husband will bring in if she does not do advanced care at home.    Labs/Studies:  Labs and Studies from the last 24hrs per EMR and Reviewed    Objective:  Temp:  [35.7 ??C (96.2 ??F)-36.3 ??C (97.3 ??F)] 36 ??C (96.8 ??F)  Heart Rate:  [61-80] 61  Resp:  [16-18] 18  BP: (115-139)/(66-77) 137/77  SpO2:  [96 %-100 %] 96 %    GEN: NAD, laying in bed  EYES: EOMI  ENT: MMM  CV: RRR  PULM: Crackles in L lung base only, moderate air movement B. No wheezing.  ABD: soft, NT/ND, +BS  EXT: No edema

## 2020-02-25 NOTE — Unmapped (Signed)
POC discussed with patient.  No falls noted during my shift thus far and I will continue to monitor patient. IV antibiotics given per orders.  Plan for 10 days of IV antibiotics.      Problem: Adult Inpatient Plan of Care  Goal: Plan of Care Review  Outcome: Progressing  Goal: Patient-Specific Goal (Individualized)  Outcome: Progressing  Goal: Absence of Hospital-Acquired Illness or Injury  Outcome: Progressing  Intervention: Identify and Manage Fall Risk  Recent Flowsheet Documentation  Taken 02/25/2020 0000 by Wynn Maudlin, RN  Safety Interventions:   fall reduction program maintained   low bed   nonskid shoes/slippers when out of bed   isolation precautions  Taken 02/24/2020 2200 by Wynn Maudlin, RN  Safety Interventions:   fall reduction program maintained   isolation precautions   low bed   nonskid shoes/slippers when out of bed  Taken 02/24/2020 2000 by Wynn Maudlin, RN  Safety Interventions:   fall reduction program maintained   isolation precautions   low bed   nonskid shoes/slippers when out of bed  Intervention: Prevent and Manage VTE (Venous Thromboembolism) Risk  Recent Flowsheet Documentation  Taken 02/24/2020 2000 by Wynn Maudlin, RN  Activity Management: activity adjusted per tolerance  Goal: Optimal Comfort and Wellbeing  Outcome: Progressing  Goal: Readiness for Transition of Care  Outcome: Progressing  Goal: Rounds/Family Conference  Outcome: Progressing     Problem: Infection  Goal: Absence of Infection Signs and Symptoms  Outcome: Progressing  Intervention: Prevent or Manage Infection  Recent Flowsheet Documentation  Taken 02/25/2020 0000 by Wynn Maudlin, RN  Isolation Precautions: contact precautions maintained  Taken 02/24/2020 2200 by Wynn Maudlin, RN  Isolation Precautions: contact precautions maintained

## 2020-02-26 DIAGNOSIS — E039 Hypothyroidism, unspecified: Principal | ICD-10-CM

## 2020-02-26 DIAGNOSIS — J479 Bronchiectasis, uncomplicated: Principal | ICD-10-CM

## 2020-02-26 DIAGNOSIS — G35 Multiple sclerosis: Principal | ICD-10-CM

## 2020-02-26 DIAGNOSIS — G43909 Migraine, unspecified, not intractable, without status migrainosus: Principal | ICD-10-CM

## 2020-02-26 DIAGNOSIS — Z7952 Long term (current) use of systemic steroids: Principal | ICD-10-CM

## 2020-02-26 DIAGNOSIS — Z7951 Long term (current) use of inhaled steroids: Principal | ICD-10-CM

## 2020-02-26 DIAGNOSIS — Z881 Allergy status to other antibiotic agents status: Principal | ICD-10-CM

## 2020-02-26 DIAGNOSIS — Z79890 Hormone replacement therapy (postmenopausal): Principal | ICD-10-CM

## 2020-02-26 DIAGNOSIS — K219 Gastro-esophageal reflux disease without esophagitis: Principal | ICD-10-CM

## 2020-02-26 DIAGNOSIS — D721 Eosinophilia, unspecified: Principal | ICD-10-CM

## 2020-02-26 DIAGNOSIS — B965 Pseudomonas (aeruginosa) (mallei) (pseudomallei) as the cause of diseases classified elsewhere: Principal | ICD-10-CM

## 2020-02-26 DIAGNOSIS — Z7722 Contact with and (suspected) exposure to environmental tobacco smoke (acute) (chronic): Principal | ICD-10-CM

## 2020-02-26 DIAGNOSIS — G4700 Insomnia, unspecified: Principal | ICD-10-CM

## 2020-02-26 DIAGNOSIS — J208 Acute bronchitis due to other specified organisms: Principal | ICD-10-CM

## 2020-02-26 DIAGNOSIS — J471 Bronchiectasis with (acute) exacerbation: Principal | ICD-10-CM

## 2020-02-26 DIAGNOSIS — Z882 Allergy status to sulfonamides status: Principal | ICD-10-CM

## 2020-02-26 DIAGNOSIS — Z20822 Contact with and (suspected) exposure to covid-19: Principal | ICD-10-CM

## 2020-02-26 MED ADMIN — ondansetron (ZOFRAN-ODT) disintegrating tablet 4 mg: 4 mg | ORAL | @ 12:00:00 | Stop: 2020-03-02

## 2020-02-26 MED ADMIN — levothyroxine (SYNTHROID) tablet 50 mcg: 50 ug | ORAL | @ 12:00:00 | Stop: 2020-03-02

## 2020-02-26 MED ADMIN — Flintstones Complete Multivitamin ***PATIENT SUPPLIED MEDICATION***: 1 | ORAL | @ 17:00:00 | Stop: 2020-03-02

## 2020-02-26 MED ADMIN — cefTAZidime (FORTAZ) 2 g in sodium chloride 0.9 % (NS) 100 mL IVPB-MBP: 2 g | INTRAVENOUS | @ 12:00:00 | Stop: 2020-03-01

## 2020-02-26 MED ADMIN — sodium chloride 3 % nebulizer solution 4 mL: 4 mL | RESPIRATORY_TRACT | @ 21:00:00 | Stop: 2020-03-02

## 2020-02-26 MED ADMIN — budesonide saline irrigation 0.6 mg/mL ***Patient Supplied Medication***: 120 mL | NASAL | @ 01:00:00 | Stop: 2020-03-02

## 2020-02-26 MED ADMIN — calcium-vitamin D3 650 mg-50 mcg ***PT SUPPLIED MED***: 1 | ORAL | @ 17:00:00 | Stop: 2020-02-26

## 2020-02-26 MED ADMIN — cefTAZidime (FORTAZ) 2 g in sodium chloride 0.9 % (NS) 100 mL IVPB-MBP: 2 g | INTRAVENOUS | @ 21:00:00 | Stop: 2020-03-01

## 2020-02-26 MED ADMIN — sodium chloride 3 % nebulizer solution 4 mL: 4 mL | RESPIRATORY_TRACT | @ 14:00:00 | Stop: 2020-03-02

## 2020-02-26 MED ADMIN — cefTAZidime (FORTAZ) 2 g in sodium chloride 0.9 % (NS) 100 mL IVPB-MBP: 2 g | INTRAVENOUS | @ 04:00:00 | Stop: 2020-03-01

## 2020-02-26 MED ADMIN — albuterol 2.5 mg /3 mL (0.083 %) nebulizer solution 2.5 mg: 2.5 mg | RESPIRATORY_TRACT | @ 01:00:00 | Stop: 2020-03-02

## 2020-02-26 MED ADMIN — ondansetron (ZOFRAN-ODT) disintegrating tablet 4 mg: 4 mg | ORAL | @ 21:00:00 | Stop: 2020-03-02

## 2020-02-26 MED ADMIN — albuterol 2.5 mg /3 mL (0.083 %) nebulizer solution 2.5 mg: 2.5 mg | RESPIRATORY_TRACT | @ 14:00:00 | Stop: 2020-03-02

## 2020-02-26 MED ADMIN — pantoprazole (PROTONIX) EC tablet 20 mg: 20 mg | ORAL | @ 14:00:00 | Stop: 2020-03-02

## 2020-02-26 MED ADMIN — predniSONE (DELTASONE) tablet 30 mg: 30 mg | ORAL | @ 14:00:00 | Stop: 2020-03-01

## 2020-02-26 MED ADMIN — propranolol ER 80 mg capsule ***PATIENT SUPPLIED MEDICATION***: 1 | ORAL | @ 17:00:00 | Stop: 2020-03-02

## 2020-02-26 MED ADMIN — amitriptyline (ELAVIL) tablet 10 mg: 10 mg | ORAL | @ 04:00:00 | Stop: 2020-03-02

## 2020-02-26 MED ADMIN — cholecalciferol (vitamin D3 25 mcg (1,000 units)) tablet 50 mcg: 50 ug | ORAL | @ 17:00:00 | Stop: 2020-02-26

## 2020-02-26 MED ADMIN — sodium chloride 3 % nebulizer solution 4 mL: 4 mL | RESPIRATORY_TRACT | @ 01:00:00 | Stop: 2020-03-02

## 2020-02-26 MED ADMIN — albuterol 2.5 mg /3 mL (0.083 %) nebulizer solution 2.5 mg: 2.5 mg | RESPIRATORY_TRACT | @ 21:00:00 | Stop: 2020-03-02

## 2020-02-26 NOTE — Unmapped (Signed)
[facility] Daily Video Progress Note    Assessment/Plan:    Principal Problem:    Bronchiectasis without complication (CMS-HCC)  Active Problems:    Pseudomonas aeruginosa colonization  Resolved Problems:    * No resolved hospital problems. *   Malnutrition Evaluation as performed by RD, LDN: Patient does not meet AND/ASPEN criteria for malnutrition at this time (02/24/20 1209)             Victoria Copeland is a 70 y.o. female that presented to [facility] with Bronchiectasis without complication (CMS-HCC).    1) Bronchiectasis exacerbation: Unclear etiology and has been undergoing outpatient evaluation for this.  Direct admission for failure to improve as outpatient.  - Continue ceftazidime - anticipate course will be 1/1-1/10  - She's completed tobramycin nebs  - Continue airway clearance with 3% saline nebs, albuterol, Aerobika  - Continue prednisone taper (will get 30 mg x 5 days, 20 mg x 5 days and then 10 mg x 5 days  - Continue protonix while on prednisone  - She'll continue to try and produce a sputum sample - still has been unsuccessful  - PFTs planned today - if improved above plan will likely remain in place - if not improving will need to re-evaluate  - Appreciate pulmonary assistance - will discuss further with them after PFTs    2) Hypothyroidism - Continue home levothyroxine    3) MS - Controlled off medications    4) Migraines, insomnia - continue amitriptyline, propranolol    5) VTE prophylaxis - Padua prediction score < 4 - continue to encourage ambulation    Estimated Date of Discharge: 03/02/19    In-home provider that assisted with visit was Catreina (NP).    I was outside the hospital [facility]).  I spent 20 minutes on real-time audio and video with the patient for a patient visit.      The patient and/or parent/guardian has been advised of the potential risks and limitations of this mode of treatment (including, but not limited to, the absence of in-person examination) and has agreed to be treated using telemedicine. The  Patient's, patient's guardian's and/or patient's family's questions regarding telemedicine have been answered.     ___________________________________________________________________    Subjective:  Notes some significant jitteriness this morning after her antibiotic infusion (that she'd not had before).  Breathing okay.  Has not been able to produce sputum sample.    Labs/Studies:    All lab results last 24 hours:    No results found for this or any previous visit (from the past 24 hour(s)).    Objective:  Video quality: Good  Audio quality: Good    Temp:  [36.2 ??C (97.2 ??F)-36.3 ??C (97.34 ??F)] 36.2 ??C (97.2 ??F)  Heart Rate:  [56-78] 56  Resp:  [16-18] 18  BP: (149-163)/(84-94) 149/86  SpO2:  [95 %-97 %] 97 %  Body mass index is 22.57 kg/m??.    Gen: No acute distress  HEENT: [place]/AT, EOMI, no scleral icterus, mucous membranes moist  Lungs: Respirations unlabored, no retractions or use of accessory muscles.  No audible cough or wheezing.  Per in-home provider, some fine crackles in the lower lung fields  Heart: Per in-home provider, regular rate and rhythm  [place]: Per in-home provider, no tenderness noted, soft, non-distended, normal bowel sounds.  Extremities: No edema, no discoloration  Skin: No jaundice or petechiae.    Neurologic: Alert and oriented x3, speech clear, no facial droop, no tremor, moves all  extremities

## 2020-02-26 NOTE — Unmapped (Signed)
Spoke with patient today to see how her airway clearance is going.  She very much likes the [place] and feels that it is working well.  She has not further needs at this time.  She will reach out me if she has any further needs.

## 2020-02-26 NOTE — Unmapped (Signed)
Patient care transferred to this RN at 0700. Patient fully alert oriented with no complaints of pain. All paperwork was completed for transfer to [facility]. There were no medical changes with the patient today.     All of the patient's medications were administered as ordered, the patient's bed remained locked and low and the call-bell was within reach. All of the patient's needs were met during my shift and the patient remained free from any falls.     The patient left the facility at 1345.       Problem: Adult Inpatient Plan of Care  Goal: Plan of Care Review  Outcome: Progressing  Goal: Patient-Specific Goal (Individualized)  Outcome: Progressing  Goal: Absence of Hospital-Acquired Illness or Injury  Outcome: Progressing  Intervention: Identify and Manage Fall Risk  Recent Flowsheet Documentation  Taken 02/25/2020 1200 by Heide Scales, RN  Safety Interventions:   fall reduction program maintained   low bed  Taken 02/25/2020 1000 by Heide Scales, RN  Safety Interventions:   fall reduction program maintained   low bed  Taken 02/25/2020 0800 by Heide Scales, RN  Safety Interventions:   fall reduction program maintained   low bed  Intervention: Prevent and Manage VTE (Venous Thromboembolism) Risk  Recent Flowsheet Documentation  Taken 02/25/2020 1200 by Heide Scales, RN  Activity Management: activity encouraged  Taken 02/25/2020 1000 by Heide Scales, RN  Activity Management: activity encouraged  Taken 02/25/2020 0800 by Heide Scales, RN  Activity Management: activity adjusted per tolerance  Goal: Optimal Comfort and Wellbeing  Outcome: Progressing  Goal: Readiness for Transition of Care  Outcome: Progressing  Goal: Rounds/Family Conference  Outcome: Progressing     Problem: Infection  Goal: Absence of Infection Signs and Symptoms  Outcome: Progressing

## 2020-02-26 NOTE — Unmapped (Signed)
[facility] Daily Video Progress Note    Assessment/Plan:    Principal Problem:    Bronchiectasis without complication (CMS-HCC)  Active Problems:    Pseudomonas aeruginosa colonization  Resolved Problems:    * No resolved hospital problems. *   Malnutrition Evaluation as performed by RD, LDN: Patient does not meet AND/ASPEN criteria for malnutrition at this time (02/24/20 1209)             Victoria Copeland is a 70 y.o. female that presented to [facility] with Bronchiectasis without complication (CMS-HCC).    1) Bronchiectasis exacerbation: Unclear etiology and has been undergoing outpatient evaluation for this.  Direct admission for failure to improve as outpatient - transferred to [facility] today  - Continue ceftazidime - anticipate course will be 1/1-1/10  - She's completed tobramycin nebs  - Continue airway clearance with 3% saline nebs, albuterol, Aerobika  - Continue prednisone taper (dosage to decrease to 30 mg tomorrow - will get 30 mg x 5 days, 20 mg x 5 days and then 10 mg x 5 days  - Continue protonix while on prednisone  - She'll continue to try and produce a sputum sample (but has been unsuccessful at this thus far)  - PFTs planned tomorrow - if improved above plan will likely remain in place - if not improving will need to re-evaluate  - Appreciate pulmonary assistance - will discuss further with them after PFTs    2) Hypothyroidism - Continue home levothyroxine    3) MS - Controlled off medications    4) Migraines, insomnia - continue amitriptyline, propranolol    5) VTE prophylaxis - Padua prediction score < 4 - continue to encourage ambulation    Estimated Date of Discharge: 03/02/19 (may be able to consider home infusion before that time)    In-home provider that assisted with visit was Abby (NS paramedic).    I was outside the hospital [facility] Control).  I spent 25 minutes on real-time audio and video with the patient for a patient visit.      The patient and/or parent/guardian has been advised of the potential risks and limitations of this mode of treatment (including, but not limited to, the absence of in-person examination) and has agreed to be treated using telemedicine. The  Patient's, patient's guardian's and/or patient's family's questions regarding telemedicine have been answered.     ___________________________________________________________________    Subjective:  Patient expresses glad to be at home.  Still feels congestion and some difficulty expterorating her secretions, but overall improved since initial admission.  Appetite okay.    Labs/Studies:    All lab results last 24 hours:    Recent Results (from the past 24 hour(s))   Sedimentation rate, manual    Collection Time: 02/25/20  6:25 AM   Result Value Ref Range    Sed Rate 30 0 - 30 mm/h   Basic Metabolic Panel    Collection Time: 02/25/20  6:25 AM   Result Value Ref Range    Sodium 138 135 - 145 mmol/L    Potassium 3.9 3.4 - 4.5 mmol/L    Chloride 105 98 - 107 mmol/L    CO2 28.9 20.0 - 31.0 mmol/L    Anion Gap 4 (L) 5 - 14 mmol/L    BUN 9 9 - 23 mg/dL    Creatinine 1.61 0.96 - 0.80 mg/dL    BUN/Creatinine Ratio 12     EGFR CKD-EPI Non-African American, Female 84 >=60 mL/min/1.46m2  EGFR CKD-EPI African American, Female >90 >=60 mL/min/1.87m2    Glucose 82 70 - 179 mg/dL    Calcium 9.6 8.7 - 16.1 mg/dL       Objective:  Video quality: Good  Audio quality: Good    Temp:  [36 ??C (96.8 ??F)-36.3 ??C (97.34 ??F)] 36.3 ??C (97.34 ??F)  Heart Rate:  [57-77] 57  Resp:  [16-18] 16  BP: (115-149)/(66-84) 149/84  SpO2:  [96 %-100 %] 96 %  Body mass index is 22.57 kg/m??.    Gen: No acute distress  HEENT: [place]/AT, EOMI, no scleral icterus, mucous membranes moist  Lungs: Respirations unlabored, no retractions or use of accessory muscles.  No audible cough or wheezing.  Per in-home provider, some scattered rhonchi in the upper lung fields  Heart: Per in-home provider, regular rate and rhythm  [place]: Per in-home provider, no tenderness noted, soft, non-distended, normal bowel sounds.  Extremities: No edema, no discoloration  Skin: No jaundice or petechiae.    Neurologic: Alert and oriented x3, speech clear, no facial droop, no tremor, moves all extremities

## 2020-02-26 NOTE — Unmapped (Signed)
Problem: Adult Inpatient Plan of Care  Goal: Plan of Care Review  Outcome: Progressing  Goal: Patient-Specific Goal (Individualized)  Outcome: Progressing  Goal: Absence of Hospital-Acquired Illness or Injury  Outcome: Progressing  Goal: Optimal Comfort and Wellbeing  Outcome: Progressing     Problem: Infection  Goal: Absence of Infection Signs and Symptoms  Outcome: Progressing     Problem: Airway Clearance Ineffective  Goal: Effective Airway Clearance  Outcome: Progressing     Patient received IV infusion tonight; visit completed with Abigail, RRP. Vitals checked; meds given. No c/o pain, N/V, diarrhea; tolerating PO intake & good appetite. Voiding, last BM x2 today 1/5, no diarrhea. Oxygen sats 95-97% on room air & no coughing or distress during video. States she feels like her chest isn't as tight & is loosening up. States she's been eating probiotic yogurt so no abdominal discomfort. 10 doses of IV abx delivered tonight as well as x3 oral meds from [facility]. Remains free of falls or injury; reviewed schedule for tomorrow. Husband at bedside; question sent to Dr. Tasia Catchings for discussion during rounds per patient request. No needs, questions at this time; going to bed now. Will continue to monitor.

## 2020-02-27 MED ADMIN — albuterol 2.5 mg /3 mL (0.083 %) nebulizer solution 2.5 mg: 2.5 mg | RESPIRATORY_TRACT | @ 21:00:00 | Stop: 2020-03-02

## 2020-02-27 MED ADMIN — levothyroxine (SYNTHROID) tablet 50 mcg: 50 ug | ORAL | @ 12:00:00 | Stop: 2020-03-02

## 2020-02-27 MED ADMIN — albuterol 2.5 mg /3 mL (0.083 %) nebulizer solution 2.5 mg: 2.5 mg | RESPIRATORY_TRACT | @ 01:00:00 | Stop: 2020-03-02

## 2020-02-27 MED ADMIN — cefTAZidime (FORTAZ) 2 g in sodium chloride 0.9 % (NS) 100 mL IVPB-MBP: 2 g | INTRAVENOUS | @ 13:00:00 | Stop: 2020-03-01

## 2020-02-27 MED ADMIN — predniSONE (DELTASONE) tablet 30 mg: 30 mg | ORAL | @ 14:00:00 | Stop: 2020-03-01

## 2020-02-27 MED ADMIN — calcium-vitamin D3 650 mg-12.5 mcg **Patient Supplied Med**: 1 | ORAL | @ 17:00:00 | Stop: 2020-02-27

## 2020-02-27 MED ADMIN — ondansetron (ZOFRAN-ODT) disintegrating tablet 4 mg: 4 mg | ORAL | @ 04:00:00 | Stop: 2020-03-02

## 2020-02-27 MED ADMIN — albuterol 2.5 mg /3 mL (0.083 %) nebulizer solution 2.5 mg: 2.5 mg | RESPIRATORY_TRACT | @ 14:00:00 | Stop: 2020-03-02

## 2020-02-27 MED ADMIN — amitriptyline (ELAVIL) tablet 10 mg: 10 mg | ORAL | @ 01:00:00 | Stop: 2020-03-02

## 2020-02-27 MED ADMIN — sodium chloride 3 % nebulizer solution 4 mL: 4 mL | RESPIRATORY_TRACT | @ 21:00:00 | Stop: 2020-03-02

## 2020-02-27 MED ADMIN — ondansetron (ZOFRAN-ODT) disintegrating tablet 4 mg: 4 mg | ORAL | @ 13:00:00 | Stop: 2020-03-02

## 2020-02-27 MED ADMIN — cefTAZidime (FORTAZ) 2 g in sodium chloride 0.9 % (NS) 100 mL IVPB-MBP: 2 g | INTRAVENOUS | @ 22:00:00 | Stop: 2020-03-01

## 2020-02-27 MED ADMIN — sodium chloride 3 % nebulizer solution 4 mL: 4 mL | RESPIRATORY_TRACT | @ 14:00:00 | Stop: 2020-03-02

## 2020-02-27 MED ADMIN — budesonide saline irrigation 0.6 mg/mL ***Patient Supplied Medication***: 120 mL | NASAL | @ 01:00:00 | Stop: 2020-03-02

## 2020-02-27 MED ADMIN — ondansetron (ZOFRAN-ODT) disintegrating tablet 4 mg: 4 mg | ORAL | @ 22:00:00 | Stop: 2020-03-02

## 2020-02-27 MED ADMIN — propranolol ER 80 mg capsule ***PATIENT SUPPLIED MEDICATION***: 1 | ORAL | @ 17:00:00 | Stop: 2020-03-02

## 2020-02-27 MED ADMIN — Flintstones Complete Multivitamin ***PATIENT SUPPLIED MEDICATION***: 1 | ORAL | @ 17:00:00 | Stop: 2020-03-02

## 2020-02-27 MED ADMIN — pantoprazole (PROTONIX) EC tablet 20 mg: 20 mg | ORAL | @ 14:00:00 | Stop: 2020-03-02

## 2020-02-27 MED ADMIN — cefTAZidime (FORTAZ) 2 g in sodium chloride 0.9 % (NS) 100 mL IVPB-MBP: 2 g | INTRAVENOUS | @ 04:00:00 | Stop: 2020-03-01

## 2020-02-27 MED ADMIN — sodium chloride 3 % nebulizer solution 4 mL: 4 mL | RESPIRATORY_TRACT | @ 01:00:00 | Stop: 2020-03-02

## 2020-02-27 NOTE — Unmapped (Signed)
Video call completed. Assessment done in home by [facility]. Fine crackles to bilateral lower lobes, posterior > anterior. BS active to all 4quads. No edema. S1S2 regular. Denies c/o pain. Patient requested Zofran to prevent nausea with antibiotic as previously experienced which was given at 2242. Reinforced PERS device use for emergent needs. Reinforced to call over video anytime she takes a medication. She endorses understanding. Will continue to monitor.     Problem: Adult Inpatient Plan of Care  Goal: Plan of Care Review  Outcome: Ongoing - Unchanged  Goal: Patient-Specific Goal (Individualized)  Outcome: Ongoing - Unchanged  Goal: Absence of Hospital-Acquired Illness or Injury  Outcome: Ongoing - Unchanged  Intervention: Prevent and Manage VTE (Venous Thromboembolism) Risk  Recent Flowsheet Documentation  Taken 02/26/2020 2300 by Rinaldo Ratel, RN  Activity Management:   up ad lib   ambulated to bathroom  Goal: Optimal Comfort and Wellbeing  Outcome: Ongoing - Unchanged  Goal: Readiness for Transition of Care  Outcome: Ongoing - Unchanged  Goal: Rounds/Family Conference  Outcome: Ongoing - Unchanged     Problem: Infection  Goal: Absence of Infection Signs and Symptoms  Outcome: Ongoing - Unchanged     Problem: Airway Clearance Ineffective  Goal: Effective Airway Clearance  Outcome: Ongoing - Unchanged  Intervention: Promote Airway Secretion Clearance  Recent Flowsheet Documentation  Taken 02/26/2020 2300 by Rinaldo Ratel, RN  Activity Management:   up ad lib   ambulated to bathroom

## 2020-02-27 NOTE — Unmapped (Signed)
Pt pleasant and cooperative. Pt out to [facility] for PFT this afternoon. Pt tolerated well. Pt continues with IV antibiotics every 8 hours. Pt has voiced no c/o this shift. Visit with NP this morning. Will continue to monitor.   Goal: Plan of Care Review  Outcome: Ongoing - Unchanged  Goal: Patient-Specific Goal (Individualized)  Outcome: Ongoing - Unchanged  Goal: Absence of Hospital-Acquired Illness or Injury  Outcome: Ongoing - Unchanged  Intervention: Prevent and Manage VTE (Venous Thromboembolism) Risk  Recent Flowsheet Documentation  Taken 02/26/2020 1613 by Ansel Bong, RN  Activity Management:   up ad lib   up in chair  Taken 02/26/2020 0744 by Ansel Bong, RN  Activity Management: up ad lib  Goal: Optimal Comfort and Wellbeing  Outcome: Ongoing - Unchanged  Goal: Readiness for Transition of Care  Outcome: Ongoing - Unchanged  Goal: Rounds/Family Conference  Outcome: Ongoing - Unchanged     Problem: Infection  Goal: Absence of Infection Signs and Symptoms  Outcome: Ongoing - Unchanged     Problem: Airway Clearance Ineffective  Goal: Effective Airway Clearance  Outcome: Ongoing - Unchanged  Intervention: Promote Airway Secretion Clearance  Recent Flowsheet Documentation  Taken 02/26/2020 1613 by Ansel Bong, RN  Activity Management:   up ad lib   up in chair  Taken 02/26/2020 0744 by Ansel Bong, RN  Activity Management: up ad lib

## 2020-02-27 NOTE — Unmapped (Signed)
[facility]      Victoria Copeland is a  70 y.o.  female  with No chief complaint on file.       Assessment:      1. Bronchiectasis exacerbation  - PFT will be scheduled today  -Continue on IV ceftazidime- expected to complete on 03/01/20. Patient prefers to take Zofran prior to infusion to avoid n/v.  -Continue on prednisone taper  -Continue on Aerobika, 3% saline nebs and albuterol     Plan:      Dr. Devoria Glassing reviewed pertinent portions of the physical exam and collaborated in the formulation of the plan of care and documentation of this visit for Victoria Copeland.              Subjective:      States she did not sleep well last night because she felt hyper due to taking prednisone. Currently feeling jittery but it improves when she gets up and move around. Denies SOB on exertion, but has some slight chest tightness. States the saline nebs help loosen up the chest tightness. Reports she would like to take Zofran prior to infusion of antibiotics to prevent n/v and lessen her anxiety. Had a BM this am. Appetite and fluid intake is appropriate.        Physical Exam:   Gen: No acute distress, afebrile  HEENT: [place]/AT, EOMI, no scleral icterus, mucous membranes moist, oral pharynx clear  Neck: Supple, normal ROM  Lungs: Respirations unlabored, no retractions or use of accessory muscles. Cough noted; no wheezing noted. Fine crackles in LLL.   Heart: Regular rate and rhythm, normal s1 and s2, no murmurs/rubs/gallops  Abdomen: Soft, non-tender, normal bowel sounds present  Extremities: No edema, no discoloration  Musculoskeletal: No joint swelling   Skin: No jaundice or petechiae, IV in L forearm  Genitourinary: No indwelling foley catheter  Neurologic: Alert and oriented x3, speech clear, no facial droop, no tremor, moves all extremities      Vitals  BP: 120/72  HR: 65  RR: 18  Temp: 97.8  SpO2: 100%  Pain: 0/10     In-Home Safety/Stability Assessment    1. Have you recently had cognitive changes No  2.   Has the caregiver noticed cognitive changes No  3.   Have you fallen in the last: No Any injuries? N/A             Are you concerned about falling? No  4.   Do you require assistance getting around the house? No  5.   If you use medical equipment, is any of it borrowed/shared with someone in the home? no  6.   Do you use a recliner in your home? yes if no, go to question 7   If yes answer the following: do you require assistance to get in or out of the recliner? no              Do you sleep overnight in the recliner no  7.   Do you require assistance with transfers, ambulation or getting to the bathroom? No                 If yes, who helps? Does this person live with you?N/A  8.   Do you have bladder/bowel urgency or incontinence? No                 If yes, have there been recent changes?  No  If yes, describe management (medication,behavioral,equipment) N/A  9.   Do you feel unsteady on your feet? no                Does the caregiver feel patient is unsteady on their feet? no  10.  Are you concerned about your balance, strength or endurance? no      Medication Reconciliation   Prior to Admission medications    Medication Dose, Route, Frequency   albuterol 2.5 mg/0.5 mL nebulizer solution 2.5 mg, Nebulization, 2 times a day (standard)   amitriptyline (ELAVIL) 10 MG tablet 10 mg, Oral, Nightly   calcium-vitamin D3-vitamin K 650 mg-12.5 mcg-40 mcg Chew 1 tablet, Oral, Daily   cholecalciferol, vitamin D3-50 mcg, 2,000 unit,, (VITAMIN D3) 50 mcg (2,000 unit) cap 50 mcg, Oral, Daily   levothyroxine (SYNTHROID) 50 MCG tablet 50 mcg, Oral, Daily before breakfast   pediatric multivit-iron-min (FLINTSTONES COMPLETE) tablet 1 tablet, Oral, Daily (standard)   propranoloL (INNOPRAN XL) 80 MG 24 hr capsule 80 mg, Oral, Daily (standard)   sodium chloride 3 % nebulizer solution 4 mL, Nebulization, 2 times a day   ascorbic acid, vitamin C, (VITAMIN C) 250 MG tablet 250 mg, Oral, Daily (standard)   calcium carbonate (TUMS) 200 mg calcium (500 mg) chewable tablet 1 tablet, Oral, 2 times a day PRN   dexAMETHasone 0.5 mg/5 mL elixir RINSE BY MOUTH FOR ONE MINUTES THEN SPIT. REPEAT FOUR TO FIVE TIMES DAILY   fluoride, sodium, 1.1 % Gel 1 application, Topical, 2 times a day (standard), Or as directed   nebulizers Misc USE AS DIRECTED   ondansetron (ZOFRAN-ODT) 4 MG disintegrating tablet Dissolve 1 tablet (4 mg total) by mouth every six (6) hours as needed for nausea.   pantoprazole (PROTONIX) 20 MG tablet 20 mg, Oral, Daily (standard)   predniSONE (DELTASONE) 10 MG tablet Please take 4 tablets for 5 days, then 3 tabs for 5 days, then 2 tabs for 5 days, then 1 tab for 5 days.   predniSONE (DELTASONE) 5 MG tablet 10 mg, Oral, Daily (standard)   promethazine (PHENERGAN) 25 MG suppository 25 mg, Rectal, Every 6 hours PRN       Medication Administration/Observation and Teaching    ??? For any medication administered confirm 5 rights with virtual visit.: patient, drug, time, dose and route.  ??? Medication teaching as required. Include purpose for medication, precautions and side effects  ??? All medication bottles obtained & med list reviewed with patient/CG including doses, timing, frequency and last dose.  ??? Complete documentation noting discrepancies and other/comments describe: None noted.    At the conclusion of the in home visit, [facility]) was contacted to set up coordinated virtual visit with attending provider.  The patient schedule was reviewed with patient/CG   Emergency Response Guide (ERG) was reviewed and patient/CG able to teach back    Next NP visit 02/28/20    [name]

## 2020-02-28 MED ADMIN — calcium-vitamin D 650 mg-12.5 mcg tablet **Patient Supplied Medication**: 1 | ORAL | @ 18:00:00 | Stop: 2020-03-02

## 2020-02-28 MED ADMIN — albuterol 2.5 mg /3 mL (0.083 %) nebulizer solution 2.5 mg: 2.5 mg | RESPIRATORY_TRACT | @ 19:00:00 | Stop: 2020-03-02

## 2020-02-28 MED ADMIN — Flintstones Complete Multivitamin ***PATIENT SUPPLIED MEDICATION***: 1 | ORAL | @ 18:00:00 | Stop: 2020-03-02

## 2020-02-28 MED ADMIN — cefTAZidime (FORTAZ) 2 g in sodium chloride 0.9 % (NS) 100 mL IVPB-MBP: 2 g | INTRAVENOUS | @ 22:00:00 | Stop: 2020-03-01

## 2020-02-28 MED ADMIN — ondansetron (ZOFRAN-ODT) disintegrating tablet 4 mg: 4 mg | ORAL | @ 05:00:00 | Stop: 2020-03-02

## 2020-02-28 MED ADMIN — sodium chloride 3 % nebulizer solution 4 mL: 4 mL | RESPIRATORY_TRACT | @ 02:00:00 | Stop: 2020-03-02

## 2020-02-28 MED ADMIN — polyethylene glycol (MIRALAX) packet 17 g: 17 g | ORAL | @ 18:00:00 | Stop: 2020-03-02

## 2020-02-28 MED ADMIN — sodium chloride 3 % nebulizer solution 4 mL: 4 mL | RESPIRATORY_TRACT | @ 15:00:00 | Stop: 2020-03-02

## 2020-02-28 MED ADMIN — albuterol 2.5 mg /3 mL (0.083 %) nebulizer solution 2.5 mg: 2.5 mg | RESPIRATORY_TRACT | @ 15:00:00 | Stop: 2020-03-02

## 2020-02-28 MED ADMIN — budesonide saline irrigation 0.6 mg/mL ***Patient Supplied Medication***: 120 mL | NASAL | @ 19:00:00 | Stop: 2020-03-02

## 2020-02-28 MED ADMIN — amitriptyline (ELAVIL) tablet 10 mg: 10 mg | ORAL | @ 02:00:00 | Stop: 2020-03-02

## 2020-02-28 MED ADMIN — ondansetron (ZOFRAN-ODT) disintegrating tablet 4 mg: 4 mg | ORAL | @ 13:00:00 | Stop: 2020-03-02

## 2020-02-28 MED ADMIN — predniSONE (DELTASONE) tablet 30 mg: 30 mg | ORAL | @ 14:00:00 | Stop: 2020-03-01

## 2020-02-28 MED ADMIN — sodium chloride 3 % nebulizer solution 4 mL: 4 mL | RESPIRATORY_TRACT | @ 19:00:00 | Stop: 2020-03-02

## 2020-02-28 MED ADMIN — ondansetron (ZOFRAN-ODT) disintegrating tablet 4 mg: 4 mg | ORAL | @ 22:00:00 | Stop: 2020-03-02

## 2020-02-28 MED ADMIN — cefTAZidime (FORTAZ) 2 g in sodium chloride 0.9 % (NS) 100 mL IVPB-MBP: 2 g | INTRAVENOUS | @ 13:00:00 | Stop: 2020-03-01

## 2020-02-28 MED ADMIN — levothyroxine (SYNTHROID) tablet 50 mcg: 50 ug | ORAL | @ 13:00:00 | Stop: 2020-03-02

## 2020-02-28 MED ADMIN — cefTAZidime (FORTAZ) 2 g in sodium chloride 0.9 % (NS) 100 mL IVPB-MBP: 2 g | INTRAVENOUS | @ 05:00:00 | Stop: 2020-03-01

## 2020-02-28 MED ADMIN — albuterol 2.5 mg /3 mL (0.083 %) nebulizer solution 2.5 mg: 2.5 mg | RESPIRATORY_TRACT | @ 02:00:00 | Stop: 2020-03-02

## 2020-02-28 MED ADMIN — propranolol ER 80 mg capsule ***PATIENT SUPPLIED MEDICATION***: 1 | ORAL | @ 18:00:00 | Stop: 2020-03-02

## 2020-02-28 MED ADMIN — pantoprazole (PROTONIX) EC tablet 20 mg: 20 mg | ORAL | @ 14:00:00 | Stop: 2020-03-02

## 2020-02-28 NOTE — Unmapped (Signed)
[facility]      Victoria Copeland is a  70 y.o.  female    Principal Problem:    Bronchiectasis without complication (CMS-HCC)  Active Problems:    Pseudomonas aeruginosa colonization  Resolved Problems:    * No resolved hospital problems. *       Assessment:      1. Bronchiectasis exacerbation  -PFTs 1/6 imporved  -Continue on IV ceftazidime- expected to complete on 03/01/20. Patient prefers to take Zofran prior to infusion to avoid n/v.  -Continue on prednisone taper  -Continue on Aerobika, 3% saline nebs and albuterol  -oxygen saturations 99% on RA     Plan:      Dr.Udayakumar reviewed pertinent portions of the physical exam and collaborated in the formulation of the plan of care and documentation of this visit for [employer].              Subjective:      Up and about the house with no complaints of sob. Feels a little jittery with prednisone. Taking it as prescribed. No GI symptoms. No cough or sob.        Physical Exam:   Gen: No acute distress, afebrile  HEENT: [place]/AT, EOMI, no scleral icterus, mucous membranes moist, oral pharynx clear  Lungs: Respirations unlabored, no retractions or use of accessory muscles. Clear to auscultation and percussion  Heart: Regular rate and rhythm, normal s1 and s2, no murmurs/rubs/gallops  Extremities: No edema, no discoloration  Skin: No jaundice or petechiae, IV in L forearm  Neurologic: Alert and oriented x3, speech clear, no facial droop, no tremor, moves all extremities        Medication Administration/Observation and Teaching    ??? For any medication administered confirm 5 rights with virtual visit.: patient, drug, time, dose and route.  ??? Medication teaching as required. Include purpose for medication, precautions and side effects  ??? All medication bottles obtained & med list reviewed with patient/CG including doses, timing, frequency and last dose.  ??? Complete documentation noting discrepancies and other/comments describe: None noted.    At the conclusion of the in home visit, [facility]) was contacted to set up coordinated virtual visit with attending provider.  The patient schedule was reviewed with patient/CG   Emergency Response Guide (ERG) was reviewed and patient/CG able to teach back    Next NP visit prn    Merleen Nicely

## 2020-02-28 NOTE — Unmapped (Signed)
[facility] Daily Video Progress Note    Assessment/Plan:    Principal Problem:    Bronchiectasis without complication (CMS-HCC)  Active Problems:    Pseudomonas aeruginosa colonization  Resolved Problems:    * No resolved hospital problems. *   Malnutrition Evaluation as performed by RD, LDN: Patient does not meet AND/ASPEN criteria for malnutrition at this time (02/24/20 1209)             Victoria Copeland is a 70 y.o. female that presented to [facility] with Bronchiectasis without complication (CMS-HCC).    1) Bronchiectasis exacerbation: Unclear etiology and has been undergoing outpatient evaluation for this.  Her PFTs yesterday demonstrated improvement.  Discussed with inpatient consulting pulmonologist who felt in light of this we could continue the current plan.  - Continue ceftazidime - anticipate course will be 1/1-1/10  - She's completed tobramycin nebs  - Continue airway clearance with 3% saline nebs, albuterol, Aerobika  - Continue prednisone taper (will get 30 mg x 5 days, 20 mg x 5 days and then 10 mg x 5 days  - Continue protonix while on prednisone  - Appreciate pulmonary assistance - I'll also message her primary pulmonologist today.      2) Hypothyroidism - Continue home levothyroxine    3) MS - Controlled off medications    4) Migraines, insomnia - continue amitriptyline, propranolol    5) VTE prophylaxis - Padua prediction score < 4 - continue to encourage ambulation    Estimated Date of Discharge: 03/02/19    In-home provider that assisted with visit was: no in-home provider available    I was outside the hospital [facility]).  I spent 15 minutes on real-time audio and video with the patient for a patient visit.      The patient and/or parent/guardian has been advised of the potential risks and limitations of this mode of treatment (including, but not limited to, the absence of in-person examination) and has agreed to be treated using telemedicine. The  Patient's, patient's guardian's and/or patient's family's questions regarding telemedicine have been answered.     ___________________________________________________________________    Subjective:  Feels breathing has improved, and notes that PFTs felt easier than her prior PFTs.  Still eating okay, ambulating in house without difficulty.    Labs/Studies:    All lab results last 24 hours:    No results found for this or any previous visit (from the past 24 hour(s)).    Objective:  Video quality: Good  Audio quality: Good    Temp:  [36.2 ??C (97.2 ??F)-36.8 ??C (98.24 ??F)] 36.6 ??C (97.8 ??F)  Heart Rate:  [54-66] 61  Resp:  [18] 18  BP: (139-166)/(76-101) 147/89  SpO2:  [84 %-97 %] 93 %  Body mass index is 22.57 kg/m??.    Gen: No acute distress  HEENT: [place]/AT, EOMI, no scleral icterus, mucous membranes moist  Lungs: Respirations unlabored, no retractions or use of accessory muscles.  No audible cough or wheezing.    [place]: No abdominal distension.   Extremities: No edema, no discoloration  Skin: No jaundice or petechiae.    Neurologic: Alert and oriented x3, speech clear, no facial droop, no tremor, moves all extremities

## 2020-02-28 NOTE — Unmapped (Signed)
Patient is alert and oriented on room air. Denies pain. Respirations even and unlabored. IV Fortaz infused without difficulty. Denies falls. No BM today. Patient states she's drinking more. Discussed with patient the plan of care to continue with IV Fortaz q8h and schedule for tomorrow.  Patient verbalized understanding. Will continue to follow.    Problem: Adult Inpatient Plan of Care  Goal: Plan of Care Review  Outcome: Ongoing - Unchanged  Goal: Patient-Specific Goal (Individualized)  Outcome: Ongoing - Unchanged  Goal: Absence of Hospital-Acquired Illness or Injury  Outcome: Ongoing - Unchanged  Intervention: Identify and Manage Fall Risk  Recent Flowsheet Documentation  Taken 02/28/2020 0000 by Romilda Garret, RN  Safety Interventions:   environmental modification   fall reduction program maintained   infection management   lighting adjusted for tasks/safety   nonskid shoes/slippers when out of bed  Taken 02/27/2020 2123 by Romilda Garret, RN  Safety Interventions:   environmental modification   fall reduction program maintained   infection management   lighting adjusted for tasks/safety   nonskid shoes/slippers when out of bed  Intervention: Prevent and Manage VTE (Venous Thromboembolism) Risk  Recent Flowsheet Documentation  Taken 02/27/2020 2123 by Romilda Garret, RN  Activity Management:   activity adjusted per tolerance   up ad lib  Goal: Optimal Comfort and Wellbeing  Outcome: Ongoing - Unchanged  Goal: Readiness for Transition of Care  Outcome: Ongoing - Unchanged  Goal: Rounds/Family Conference  Outcome: Ongoing - Unchanged     Problem: Infection  Goal: Absence of Infection Signs and Symptoms  Outcome: Ongoing - Unchanged     Problem: Airway Clearance Ineffective  Goal: Effective Airway Clearance  Outcome: Ongoing - Unchanged  Intervention: Promote Airway Secretion Clearance  Recent Flowsheet Documentation  Taken 02/27/2020 2123 by Romilda Garret, RN  Activity Management:   activity adjusted per tolerance   up ad lib

## 2020-02-28 NOTE — Unmapped (Signed)
Pt feels improved this shift. Pt had virtual meetings with MD and RN and in person visits with [facility] and RRP.  NO acute events this shift.    Problem: Adult Inpatient Plan of Care  Goal: Plan of Care Review  Outcome: Progressing  Goal: Patient-Specific Goal (Individualized)  Outcome: Progressing  Goal: Absence of Hospital-Acquired Illness or Injury  Outcome: Progressing  Goal: Optimal Comfort and Wellbeing  Outcome: Progressing  Goal: Readiness for Transition of Care  Outcome: Progressing  Goal: Rounds/Family Conference  Outcome: Progressing

## 2020-02-29 MED ORDER — PANTOPRAZOLE 20 MG TABLET,DELAYED RELEASE
ORAL_TABLET | Freq: Every day | ORAL | 0 refills | 12 days | Status: CP
Start: 2020-02-29 — End: 2020-03-12
  Filled 2020-02-29: qty 7, 7d supply, fill #0

## 2020-02-29 MED ADMIN — cefTAZidime (FORTAZ) 2 g in sodium chloride 0.9 % (NS) 100 mL IVPB-MBP: 2 g | INTRAVENOUS | @ 12:00:00 | Stop: 2020-03-01

## 2020-02-29 MED ADMIN — budesonide saline irrigation 0.6 mg/mL ***Patient Supplied Medication***: 120 mL | NASAL | @ 02:00:00 | Stop: 2020-03-02

## 2020-02-29 MED ADMIN — budesonide saline irrigation 0.6 mg/mL ***Patient Supplied Medication***: 120 mL | NASAL | @ 21:00:00 | Stop: 2020-03-02

## 2020-02-29 MED ADMIN — predniSONE (DELTASONE) tablet 30 mg: 30 mg | ORAL | @ 14:00:00 | Stop: 2020-03-01

## 2020-02-29 MED ADMIN — Flintstones Complete Multivitamin ***PATIENT SUPPLIED MEDICATION***: 1 | ORAL | @ 18:00:00 | Stop: 2020-03-02

## 2020-02-29 MED ADMIN — albuterol 2.5 mg /3 mL (0.083 %) nebulizer solution 2.5 mg: 2.5 mg | RESPIRATORY_TRACT | @ 15:00:00 | Stop: 2020-03-02

## 2020-02-29 MED ADMIN — amitriptyline (ELAVIL) tablet 10 mg: 10 mg | ORAL | @ 02:00:00 | Stop: 2020-03-02

## 2020-02-29 MED ADMIN — sodium chloride 3 % nebulizer solution 4 mL: 4 mL | RESPIRATORY_TRACT | @ 02:00:00 | Stop: 2020-03-02

## 2020-02-29 MED ADMIN — albuterol 2.5 mg /3 mL (0.083 %) nebulizer solution 2.5 mg: 2.5 mg | RESPIRATORY_TRACT | @ 02:00:00 | Stop: 2020-03-02

## 2020-02-29 MED ADMIN — pantoprazole (PROTONIX) EC tablet 20 mg: 20 mg | ORAL | @ 14:00:00 | Stop: 2020-03-02

## 2020-02-29 MED ADMIN — sodium chloride 3 % nebulizer solution 4 mL: 4 mL | RESPIRATORY_TRACT | @ 15:00:00 | Stop: 2020-03-02

## 2020-02-29 MED ADMIN — sodium chloride 3 % nebulizer solution 4 mL: 4 mL | RESPIRATORY_TRACT | @ 21:00:00 | Stop: 2020-03-02

## 2020-02-29 MED ADMIN — propranolol ER 80 mg capsule ***PATIENT SUPPLIED MEDICATION***: 1 | ORAL | @ 18:00:00 | Stop: 2020-03-02

## 2020-02-29 MED ADMIN — cefTAZidime (FORTAZ) 2 g in sodium chloride 0.9 % (NS) 100 mL IVPB-MBP: 2 g | INTRAVENOUS | @ 19:00:00 | Stop: 2020-03-01

## 2020-02-29 MED ADMIN — ondansetron (ZOFRAN-ODT) disintegrating tablet 4 mg: 4 mg | ORAL | @ 04:00:00 | Stop: 2020-03-02

## 2020-02-29 MED ADMIN — ondansetron (ZOFRAN-ODT) disintegrating tablet 4 mg: 4 mg | ORAL | @ 19:00:00 | Stop: 2020-03-02

## 2020-02-29 MED ADMIN — levothyroxine (SYNTHROID) tablet 50 mcg: 50 ug | ORAL | @ 12:00:00 | Stop: 2020-03-02

## 2020-02-29 MED ADMIN — polyethylene glycol (MIRALAX) packet 17 g: 17 g | ORAL | @ 15:00:00 | Stop: 2020-03-02

## 2020-02-29 MED ADMIN — cefTAZidime (FORTAZ) 2 g in sodium chloride 0.9 % (NS) 100 mL IVPB-MBP: 2 g | INTRAVENOUS | @ 04:00:00 | Stop: 2020-03-01

## 2020-02-29 MED ADMIN — calcium-vitamin D 650 mg-12.5 mcg tablet **Patient Supplied Medication**: 1 | ORAL | @ 18:00:00 | Stop: 2020-03-02

## 2020-02-29 MED ADMIN — albuterol 2.5 mg /3 mL (0.083 %) nebulizer solution 2.5 mg: 2.5 mg | RESPIRATORY_TRACT | @ 21:00:00 | Stop: 2020-03-02

## 2020-02-29 MED FILL — PREDNISONE 10 MG TABLET: ORAL | 7 days supply | Qty: 12 | Fill #0

## 2020-02-29 NOTE — Unmapped (Signed)
Patient is alert and oriented on room air. Denies pain. Respirations even and unlabored. Denies falls. No BM today. Discussed with patient the plan of care to continue with IV Fortaz q8h and schedule for tomorrow.  Patient verbalized understanding. Will continue to follow.    Problem: Adult Inpatient Plan of Care  Goal: Plan of Care Review  Outcome: Ongoing - Unchanged  Goal: Patient-Specific Goal (Individualized)  Outcome: Ongoing - Unchanged  Goal: Absence of Hospital-Acquired Illness or Injury  Outcome: Ongoing - Unchanged  Intervention: Identify and Manage Fall Risk  Recent Flowsheet Documentation  Taken 02/28/2020 2300 by Romilda Garret, RN  Safety Interventions:   environmental modification   fall reduction program maintained   infection management   lighting adjusted for tasks/safety   nonskid shoes/slippers when out of bed  Taken 02/28/2020 2030 by Romilda Garret, RN  Safety Interventions:   environmental modification   fall reduction program maintained   infection management   lighting adjusted for tasks/safety   nonskid shoes/slippers when out of bed  Intervention: Prevent and Manage VTE (Venous Thromboembolism) Risk  Recent Flowsheet Documentation  Taken 02/28/2020 2300 by Romilda Garret, RN  Activity Management:   activity adjusted per tolerance   up ad lib  Goal: Optimal Comfort and Wellbeing  Outcome: Ongoing - Unchanged  Goal: Readiness for Transition of Care  Outcome: Ongoing - Unchanged  Goal: Rounds/Family Conference  Outcome: Ongoing - Unchanged     Problem: Infection  Goal: Absence of Infection Signs and Symptoms  Outcome: Ongoing - Unchanged     Problem: Airway Clearance Ineffective  Goal: Effective Airway Clearance  Outcome: Ongoing - Unchanged  Intervention: Promote Airway Secretion Clearance  Recent Flowsheet Documentation  Taken 02/28/2020 2300 by Romilda Garret, RN  Activity Management:   activity adjusted per tolerance   up ad lib

## 2020-02-29 NOTE — Unmapped (Signed)
[facility] Home Daily Video Progress Note    Assessment/Plan:    Principal Problem:    Bronchiectasis without complication (CMS-HCC)  Active Problems:    Pseudomonas aeruginosa colonization                Victoria Copeland is a 70 y.o. female that presented to [facility] with Bronchiectasis without complication (CMS-HCC).    1) Bronchiectasis exacerbation: Unclear etiology and has been undergoing outpatient evaluation for this.   PFTs demonstrated improvement.    - Continue ceftazidime - anticipate course will be 1/1-1/10  - completed tobramycin nebs  - Continue airway clearance with 3% saline nebs, albuterol, Aerobika  - Continue prednisone taper (30 mg x 5 days, 20 mg x 5 days and then 10 mg x 5 days  - Continue protonix while on prednisone    2) Hypothyroidism - Continue home levothyroxine    3) MS - Controlled off medications    4) Migraines, insomnia - continue amitriptyline, propranolol    5) VTE prophylaxis - Padua prediction score < 4 - continue to encourage ambulation    Estimated Date of Discharge: 03/03/19    In-home provider that assisted with visit was: Selena Batten, RN    I was outside the hospital [facility]).  I spent 15 minutes on real-time audio and video with the patient for a patient visit.      The patient and/or parent/guardian has been advised of the potential risks and limitations of this mode of treatment (including, but not limited to, the absence of in-person examination) and has agreed to be treated using telemedicine. The  Patient's, patient's guardian's and/or patient's family's questions regarding telemedicine have been answered.     ___________________________________________________________________    Subjective:  Breathing has improved. Reviewed Prednisone taper instructions and antibiotic course.     Labs/Studies:    All lab results last 24 hours:      Objective:  Video quality: Good  Audio quality: Good    Temp:  [35.4 ??C (95.7 ??F)-36.6 ??C (97.8 ??F)] 35.4 ??C (95.7 ??F)  Heart Rate: [52-61] 55  Resp:  [16-18] 16  BP: (125-147)/(79-90) 125/79  SpO2:  [93 %-99 %] 99 %  Body mass index is 22.57 kg/m??.      HEENT: [place]/AT, EOMI, no scleral icterus, mucous membranes moist  Lungs: Respirations unlabored, no retractions or use of accessory muscles.  No audible cough or wheezing.  Clear  [place]: No abdominal distension.   Extremities: No edema, no discoloration  Skin: No jaundice or petechiae.    Neurologic: Alert and oriented x3, speech clear, no facial droop, no tremor, moves all extremities

## 2020-02-29 NOTE — Unmapped (Signed)
[facility] Home Daily Video Progress Note    Assessment/Plan:    Principal Problem:    Bronchiectasis without complication (CMS-HCC)  Active Problems:    Pseudomonas aeruginosa colonization                Mahoganie Basher is a 70 y.o. female that presented to [facility] with Bronchiectasis without complication (CMS-HCC).    1) Bronchiectasis exacerbation: Unclear etiology and has been undergoing outpatient evaluation for this.   PFTs demonstrated improvement.    - Continue ceftazidime -1/1-1/10  - completed tobramycin nebs  - Continue airway clearance with 3% saline nebs, albuterol, Aerobika  - Continue prednisone taper (30 mg x 5 days, 20 mg x 5 days and then 10 mg x 5 days  - Continue protonix while on prednisone    2) Hypothyroidism - Continue home levothyroxine    3) MS - Controlled off medications    4) Migraines, insomnia - continue amitriptyline, propranolol    5) VTE prophylaxis - Padua prediction score < 4 - continue to encourage ambulation    Estimated Date of Discharge: 03/03/19    In-home provider that assisted with visit was: Fleet Contras, RN    I was outside the hospital [facility]).  I spent 15 minutes on real-time audio and video with the patient for a patient visit.      The patient and/or parent/guardian has been advised of the potential risks and limitations of this mode of treatment (including, but not limited to, the absence of in-person examination) and has agreed to be treated using telemedicine. The  Patient's, patient's guardian's and/or patient's family's questions regarding telemedicine have been answered.     ___________________________________________________________________    Subjective:  Breathing has improved. Having side effects from Prednisone (nausea and difficulty sleeping). Trying not to take zofran as she is getting constipated    Labs/Studies:    All lab results last 24 hours:      Objective:  Video quality: Good  Audio quality: Good    Temp:  [36.3 ??C (97.3 ??F)-36.8 ??C (98.3 ??F)] 36.8 ??C (98.3 ??F)  Heart Rate:  [50-59] 59  Resp:  [18] 18  BP: (124-156)/(79-89) 124/79  SpO2:  [94 %-96 %] 94 %  Body mass index is 22.57 kg/m??.      HEENT: [place]/AT, EOMI, no scleral icterus, mucous membranes moist  Lungs: Respirations unlabored, no retractions or use of accessory muscles.  No audible cough .  Mild wheezing  [place]: benign  Extremities: No edema, no discoloration  Skin: No jaundice or petechiae.    Neurologic: Alert and oriented x3, speech clear, no facial droop, no tremor, moves all extremities

## 2020-02-29 NOTE — Unmapped (Signed)
Pt well-appearing this shift. In good spirits.  Pt had in person visits with NP, RN and RRP this shift.  Pt taking nebulizer as scheduled.  No acute events this shift.        Problem: Adult Inpatient Plan of Care  Goal: Plan of Care Review  Outcome: Progressing  Goal: Patient-Specific Goal (Individualized)  Outcome: Progressing  Goal: Absence of Hospital-Acquired Illness or Injury  Outcome: Progressing  Goal: Optimal Comfort and Wellbeing  Outcome: Progressing  Goal: Readiness for Transition of Care  Outcome: Progressing  Goal: Rounds/Family Conference  Outcome: Progressing     Problem: Infection  Goal: Absence of Infection Signs and Symptoms  Outcome: Progressing     Problem: Airway Clearance Ineffective  Goal: Effective Airway Clearance  Outcome: Progressing

## 2020-03-01 MED ADMIN — sodium chloride 3 % nebulizer solution 4 mL: 4 mL | RESPIRATORY_TRACT | @ 15:00:00 | Stop: 2020-03-02

## 2020-03-01 MED ADMIN — cefTAZidime (FORTAZ) 2 g in sodium chloride 0.9 % (NS) 100 mL IVPB-MBP: 2 g | INTRAVENOUS | @ 19:00:00 | Stop: 2020-03-01

## 2020-03-01 MED ADMIN — pantoprazole (PROTONIX) EC tablet 20 mg: 20 mg | ORAL | @ 14:00:00 | Stop: 2020-03-02

## 2020-03-01 MED ADMIN — cefTAZidime (FORTAZ) 2 g in sodium chloride 0.9 % (NS) 100 mL IVPB-MBP: 2 g | INTRAVENOUS | @ 12:00:00 | Stop: 2020-03-01

## 2020-03-01 MED ADMIN — albuterol 2.5 mg /3 mL (0.083 %) nebulizer solution 2.5 mg: 2.5 mg | RESPIRATORY_TRACT | @ 20:00:00 | Stop: 2020-03-02

## 2020-03-01 MED ADMIN — albuterol 2.5 mg /3 mL (0.083 %) nebulizer solution 2.5 mg: 2.5 mg | RESPIRATORY_TRACT | @ 15:00:00 | Stop: 2020-03-02

## 2020-03-01 MED ADMIN — predniSONE (DELTASONE) tablet 30 mg: 30 mg | ORAL | @ 14:00:00 | Stop: 2020-03-01

## 2020-03-01 MED ADMIN — cefTAZidime (FORTAZ) 2 g in sodium chloride 0.9 % (NS) 100 mL IVPB-MBP: 2 g | INTRAVENOUS | @ 03:00:00 | Stop: 2020-03-01

## 2020-03-01 MED ADMIN — levothyroxine (SYNTHROID) tablet 50 mcg: 50 ug | ORAL | @ 12:00:00 | Stop: 2020-03-02

## 2020-03-01 MED ADMIN — sodium chloride 3 % nebulizer solution 4 mL: 4 mL | RESPIRATORY_TRACT | @ 01:00:00 | Stop: 2020-03-02

## 2020-03-01 MED ADMIN — Flintstones Complete Multivitamin ***PATIENT SUPPLIED MEDICATION***: 1 | ORAL | @ 17:00:00 | Stop: 2020-03-02

## 2020-03-01 MED ADMIN — amitriptyline (ELAVIL) tablet 10 mg: 10 mg | ORAL | @ 01:00:00 | Stop: 2020-03-02

## 2020-03-01 MED ADMIN — sodium chloride 3 % nebulizer solution 4 mL: 4 mL | RESPIRATORY_TRACT | @ 20:00:00 | Stop: 2020-03-02

## 2020-03-01 MED ADMIN — calcium-vitamin D 650 mg-12.5 mcg tablet **Patient Supplied Medication**: 1 | ORAL | @ 17:00:00 | Stop: 2020-03-02

## 2020-03-01 MED ADMIN — albuterol 2.5 mg /3 mL (0.083 %) nebulizer solution 2.5 mg: 2.5 mg | RESPIRATORY_TRACT | @ 01:00:00 | Stop: 2020-03-02

## 2020-03-01 MED ADMIN — budesonide saline irrigation 0.6 mg/mL ***Patient Supplied Medication***: 120 mL | NASAL | @ 15:00:00 | Stop: 2020-03-02

## 2020-03-01 MED ADMIN — propranolol ER 80 mg capsule ***PATIENT SUPPLIED MEDICATION***: 1 | ORAL | @ 17:00:00 | Stop: 2020-03-02

## 2020-03-01 NOTE — Unmapped (Signed)
Problem: Adult Inpatient Plan of Care  Goal: Plan of Care Review  Outcome: Ongoing - Unchanged  Goal: Patient-Specific Goal (Individualized)  Outcome: Progressing  Goal: Absence of Hospital-Acquired Illness or Injury  Outcome: Progressing  Goal: Optimal Comfort and Wellbeing  Outcome: Progressing  Goal: Readiness for Transition of Care  Outcome: Progressing  Goal: Rounds/Family Conference  Outcome: Progressing   Patient alert, oriented x 4.  Able to talk in full sentences without SOB.  Claims she feels much better overall.  Still some wheezing but improved.  Feels some jitteriness is related to the prednisone.  Complaint with all care requirements.

## 2020-03-01 NOTE — Unmapped (Signed)
Adult Nutrition Progress Note    Visit Type: Follow-Up  Reason for Visit: PO Intake      Nutrition Progress:  Patient reports eating better than a week ago. She reports moving from peanut butter crackers for lunch to having fruit and half a [place] sandwich.  She can't consume much red meat due to history of gallstones, but can eat chicken, [place], and fish. She reports that she can eat some dairy, but not much: 1/2 cup ice cream or custard. She takes synthroid in the morning and doesn't eat her first meal until around 10:30-11am.     Anthropometric Data:  Height: 157.5 cm (5' 2)   Admission weight: 56 kg (123 lb 6.4 oz)  Last recorded weight: 56 kg (123 lb 6.4 oz)  IBW: 49.97 kg  Percent IBW: 112.02 %  BMI: Body mass index is 22.57 kg/m??.   Usual Body Weight: 127 lbs    Weight history prior to admission:    Wt Readings from Last 10 Encounters:   02/21/20 56 kg (123 lb 6.4 oz)   02/18/20 56 kg (123 lb 6.4 oz)   01/05/20 58.5 kg (129 lb)   09/17/19 59 kg (130 lb)   07/10/19 59.7 kg (131 lb 9.6 oz)   05/01/19 59 kg (130 lb)   02/12/19 58.1 kg (128 lb)        Weight changes this admission:   Last 5 Recorded Weights    02/21/20 1515   Weight: 56 kg (123 lb 6.4 oz)        Nutrition Focused Physical Exam:  Unable to complete at this time due to patients consult conducted virtually. Patient did not look malnourished.      NUTRITIONALLY RELEVANT DATA     Medications:   Nutritionally pertinent medications reviewed and evaluated for potential food and/or medication interactions and include synthroid, mirilax, prednisone, calcium-vitamin D and multivitamin.    Labs:   Nutritionally pertinent labs reviewed.     Nutrition History:   March 01, 2020: Prior to admission:  Patient reports a fair PO intake PTA. She reports current intake is good for her. Reported a sandwich + chips + coke for lunch which is what she typically eats. Still awaiting tray but feels motivated to eat. She denies any N/V or chewing/swallowing impairments at this time. Reports eating yogurt for gut health due to antibiotic use. Stated UBW is 127lb which she reports last weighing over the past year.      Allergies, Intolerances, Sensitivities, and/or Cultural/Religious Dietary Restrictions: none identified per chart review at this time     Current Nutrition:  Oral intake        Nutrition Orders   (From admission, onward)             Start     Ordered    02/25/20 1524  Nutrition Therapy Regular/House  Effective now        Question:  Nutrition Therapy:  Answer:  Regular/House    02/25/20 1523                   Nutritional Needs:   Healthy balance of carbohydrate, protein, and fat.       Malnutrition assessment not yet completed at this time due to inability to complete nutrition focused physical exam (NFPE).    GOALS and EVALUATION     ??? Patient to consume 75% or greater of po intake via combination of meals, snacks, and/or oral supplements within 7 days..  - New  Motivation, Barriers, and Compliance:  Evaluation of motivation, barriers, and compliance completed. No concerns identified at this time.     NUTRITION ASSESSMENT     ??? Patient eats some red meat but was wondering about how to get extra protein in. She reports that she can eat chicken, fish, and [place] but has a problem eating red meat.  She was advised to add in some greek yogurt and cheese to increase her protein intake.        Discharge Planning:   No discharge needs identified at this time.       NUTRITION INTERVENTIONS and RECOMMENDATION     1. Recommended adding cheese and greek yogurt for protein  2. Other nutrition interventions and recommendations not necessary at this time.     Follow-Up Parameters:   Signing off at this time (Please reconsult if needed)    Clydell Hakim, Dietetic Intern    I was present for this patient's assessment and agree with the intern's findings.     Jenny Reichmann, RD, LDN  [phone]

## 2020-03-01 NOTE — Unmapped (Signed)
Problem: Adult Inpatient Plan of Care  Goal: Plan of Care Review  Outcome: Progressing  Goal: Patient-Specific Goal (Individualized)  Outcome: Progressing  Goal: Absence of Hospital-Acquired Illness or Injury  Outcome: Progressing  Intervention: Identify and Manage Fall Risk  Recent Flowsheet Documentation  Taken 02/29/2020 2310 by Andria Meuse, RN  Safety Interventions:   isolation precautions   lighting adjusted for tasks/safety  Goal: Optimal Comfort and Wellbeing  Outcome: Progressing     Problem: Infection  Goal: Absence of Infection Signs and Symptoms  Outcome: Progressing  Intervention: Prevent or Manage Infection  Recent Flowsheet Documentation  Taken 02/29/2020 2310 by Andria Meuse, RN  Isolation Precautions:   contact precautions maintained   droplet precautions maintained     Problem: Airway Clearance Ineffective  Goal: Effective Airway Clearance  Outcome: Progressing     Victoria Copeland, RRP completed visit; vitals checked, assessed, given meds. Reviewed appointments for tomorrow; no questions, needs at this time. No c/o pain, N/V, diarrhea; voiding & had large BM this morning 1/9. Tolerating PO intake. Lungs clear, no coughing. States feeling good at this time; declined 2nd dose budesonide. Going to bed now; will continue to monitor.

## 2020-03-02 MED ORDER — PREDNISONE 10 MG TABLET
ORAL_TABLET | ORAL | 0 refills | 3.00000 days | Status: CP
Start: 2020-03-02 — End: 2020-04-07
  Filled 2020-03-02: qty 3, 3d supply, fill #1

## 2020-03-02 MED ADMIN — predniSONE (DELTASONE) tablet 20 mg: 20 mg | ORAL | @ 15:00:00 | Stop: 2020-03-02

## 2020-03-02 MED ADMIN — cefTAZidime (FORTAZ) 2 g in sodium chloride 0.9 % (NS) 100 mL IVPB-MBP: 2 g | INTRAVENOUS | @ 04:00:00 | Stop: 2020-03-01

## 2020-03-02 MED ADMIN — pantoprazole (PROTONIX) EC tablet 20 mg: 20 mg | ORAL | @ 15:00:00 | Stop: 2020-03-02

## 2020-03-02 MED ADMIN — amitriptyline (ELAVIL) tablet 10 mg: 10 mg | ORAL | @ 02:00:00 | Stop: 2020-03-02

## 2020-03-02 MED ADMIN — levothyroxine (SYNTHROID) tablet 50 mcg: 50 ug | ORAL | @ 14:00:00 | Stop: 2020-03-02

## 2020-03-02 MED ADMIN — sodium chloride 3 % nebulizer solution 4 mL: 4 mL | RESPIRATORY_TRACT | @ 02:00:00 | Stop: 2020-03-02

## 2020-03-02 MED ADMIN — albuterol 2.5 mg /3 mL (0.083 %) nebulizer solution 2.5 mg: 2.5 mg | RESPIRATORY_TRACT | @ 02:00:00 | Stop: 2020-03-02

## 2020-03-02 MED ADMIN — budesonide saline irrigation 0.6 mg/mL ***Patient Supplied Medication***: 120 mL | NASAL | @ 02:00:00 | Stop: 2020-03-02

## 2020-03-02 NOTE — Unmapped (Signed)
Physician Discharge Summary [facility]  [facility] [place]  [address]  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 female with a h/o bronchiectasis, Hypothyroidism, MS, Migraines that presented to [facility] with a Bronchiectasis exacerbation. Of note, she is currently undergoing an outpt eval for CF (awaiting results) and other etiologies.  Previous sputum cultures showed  Actinomyces species, P aeruginosa, Penicillium species. She previously had no  improvement with levofloxacin (12/7-12/21).      The patient was admitted and treated with ceftaz (started 1/1 - 1/10) to treat P aeruginosa . She continued tobra nebs through 1/4 for a month of treatment as previously planned and airway clearance w 3% saline nebs, albuterol, Aerobika. She was initiated on prednisone taper started 1/1 (40mg  x5d, 30mg  x 5d, 20mg  x 5d, 10mg  x 5d) for peripheral eosinophilia per Pulmonary recommendation     At this point she is breathing comfortably on room air, even with exertion. Lungs are clear. She will be discharged today and will finish her Prednisone taper. She will follow up with Pulmonary on 04/07/20 with a repeat chest CT.      Procedures:    No admission procedures for hospital encounter.  ______________________________________________________________________  Discharge Medications:     Medication List      START taking these medications    ??? ondansetron 4 MG disintegrating tablet; Commonly known as: ZOFRAN-ODT;   Dissolve 1 tablet (4 mg total) by mouth every six (6) hours as needed for   nausea.  ??? pantoprazole 20 MG tablet; Commonly known as: PROTONIX; Take 1 tablet   (20 mg total) by mouth daily for 12 days.     CHANGE how you take these medications    ??? predniSONE 10 MG tablet; Commonly known as: DELTASONE; Take 2 tablets   (20 mg total) by mouth daily for 5 days, THEN 1 tablet (10 mg total) daily   for 5 days.; Start taking on: March 02, 2020; What changed: See the new   instructions.     CONTINUE taking these medications    ??? albuterol 2.5 mg/0.5 mL nebulizer solution; Inhale 0.5 mL (2.5 mg total)   by nebulization Two (2) times a day.  ??? amitriptyline 10 MG tablet; Commonly known as: ELAVIL  ??? calcium carbonate 200 mg calcium (500 mg) chewable tablet; Commonly   known as: TUMS  ??? calcium-vitamin D3-vitamin K 650 mg-12.5 mcg-40 mcg Chew  ??? dexAMETHasone 0.5 mg/5 mL elixir  ??? flintstones complete tablet; Generic drug: pediatric multivit-iron-min  ??? fluoride (sodium) 1.1 % Gel  ??? LC PLUS Misc; Generic drug: nebulizers; USE AS DIRECTED  ??? levothyroxine 50 MCG tablet; Commonly known as: SYNTHROID  ??? promethazine 25 MG suppository; Commonly known as: PHENERGAN  ??? propranoloL 80 MG 24 hr capsule; Commonly known as: INNOPRAN XL  ???  sodium chloride 3 % nebulizer solution; Inhale 4 mL by nebulization two   (2) times a day.  ??? vitamin C 250 MG tablet; Generic drug: ascorbic acid (vitamin C)  ??? VITAMIN D3 50 mcg (2,000 unit) Cap; Generic drug: cholecalciferol   (vitamin D3-50 mcg (2,000 unit))       Allergies:  Doxycycline, Erythromycin, and Sulfa (sulfonamide antibiotics)  ______________________________________________________________________  Pending Test Results (if blank, then none):      Most Recent Labs:  All lab results last 24 hours - No results found for this or any previous visit (from the past 24 hour(s)).    Relevant Studies/Radiology (if blank, then none):  No results found.  ______________________________________________________________________  Discharge Instructions:   Activity Instructions     Activity as tolerated            Diet Instructions     Discharge diet (specify)      Discharge Nutrition Therapy: Regular          Other Instructions     Call MD for:  difficulty breathing, headache or visual disturbances      Call MD for: Temperature > 38.5 Celsius ( > 101.3 Fahrenheit)      Discharge instructions to patient: Call your Specialist doctor and make an appointment to see them (specify):      Specialist: Pulmonary  As directed on 04/07/20 with CT scan    Discharge instructions to patient: Call your primary care doctor and make an appointment to see them:      Within 2 weeks from the time you are discharged from the hospital          Follow Up instructions and Outpatient Referrals     Call MD for:  difficulty breathing, headache or visual disturbances      Call MD for: Temperature > 38.5 Celsius ( > 101.3 Fahrenheit)          Appointments which have been scheduled for you    Apr 07, 2020  9:20 AM  (Arrive by 9:05 AM)  CT CHEST WO CONTRAST with [facility] RM 1  IMG CT [facility]) [address]  [place] [address]  [phone]   Let us know if pt:  Pregnant or nursing  Claustrophobic    (Title:[id number])     Apr 07, 2020 11:30 AM  (Arrive by 11:15 AM)  RETURN  GENERAL with Esther Hardy, MD  [facility] PULMONARY SPECIALTY CL [place]  [facility]) [address]  [place] [address]  [phone]           ______________________________________________________________________  Discharge Day Services:  BP 114/76  - Pulse 51  - Temp 36.6 ??C (97.88 ??F) (Oral)  - Resp 16  - Ht 157.5 cm (5' 2)  - Wt 56 kg (123 lb 6.4 oz)  - SpO2 99%  - BMI 22.57 kg/m??   Patient seen on the day of discharge via virtual video visit and was deemed appropriate for discharge.    Condition at Discharge: good    Length of Discharge: I spent greater than 30 mins in the discharge of this patient.    The patient and/or parent/guardian has been advised of the potential risks and limitations of this mode of treatment (including, but not limited to, the absence of in-person examination) and has agreed to be treated using telemedicine. The  Patient's, patient's guardian's and/or patient's family's questions regarding telemedicine have been answered.

## 2020-03-02 NOTE — Unmapped (Signed)
A & O x 4. Discharging today. Went over AVS with patient. Patient will access paperwork through mychart. IV removed yesterday. All questions answered. Will continue to monitor.   Problem: Adult Inpatient Plan of Care  Goal: Plan of Care Review  Outcome: Ongoing - Unchanged  Goal: Patient-Specific Goal (Individualized)  Outcome: Ongoing - Unchanged  Goal: Absence of Hospital-Acquired Illness or Injury  Outcome: Ongoing - Unchanged  Goal: Optimal Comfort and Wellbeing  Outcome: Ongoing - Unchanged  Goal: Readiness for Transition of Care  Outcome: Ongoing - Unchanged  Goal: Rounds/Family Conference  Outcome: Ongoing - Unchanged

## 2020-03-02 NOTE — Unmapped (Signed)
A & O x 4. VSS. Denies pain. Calls when taking medications. Patient will get last dose of abx tonight and per md can remove IV. Possible discharge tomorrow. Will continue to monitor.   Problem: Adult Inpatient Plan of Care  Goal: Plan of Care Review  Outcome: Ongoing - Unchanged  Goal: Patient-Specific Goal (Individualized)  Outcome: Ongoing - Unchanged  Goal: Absence of Hospital-Acquired Illness or Injury  Outcome: Ongoing - Unchanged  Goal: Optimal Comfort and Wellbeing  Outcome: Ongoing - Unchanged  Goal: Readiness for Transition of Care  Outcome: Ongoing - Unchanged  Goal: Rounds/Family Conference  Outcome: Ongoing - Unchanged

## 2020-03-02 NOTE — Unmapped (Signed)
Problem: Adult Inpatient Plan of Care  Goal: Plan of Care Review  Outcome: Progressing  Goal: Patient-Specific Goal (Individualized)  Outcome: Progressing  Goal: Absence of Hospital-Acquired Illness or Injury  Outcome: Progressing  Intervention: Identify and Manage Fall Risk  Recent Flowsheet Documentation  Taken 03/01/2020 2115 by Cottie Banda, RN  Safety Interventions:   environmental modification   fall reduction program maintained   infection management   isolation precautions  Goal: Optimal Comfort and Wellbeing  Outcome: Progressing  Goal: Readiness for Transition of Care  Outcome: Progressing  Goal: Rounds/Family Conference  Outcome: Progressing     Problem: Infection  Goal: Absence of Infection Signs and Symptoms  Outcome: Progressing  Intervention: Prevent or Manage Infection  Recent Flowsheet Documentation  Taken 03/01/2020 2115 by Cottie Banda, RN  Isolation Precautions: contact precautions maintained     Problem: Airway Clearance Ineffective  Goal: Effective Airway Clearance  Outcome: Progressing   Patient progressing well without complications.  Pt did receive her last dose of Fortaz last evening, tolerated well and PIV was removed per MD orders.  Pt has been calm and cooperative.  ID band on, spouse for family support, contact precautions maintained, VSS and pt has steady gait.  Anticipate discharge in the am.  Will continue to monitor.

## 2020-03-02 NOTE — Unmapped (Signed)
[facility] Daily Video Progress Note    Assessment/Plan:    Principal Problem:    Bronchiectasis without complication (CMS-HCC)  Active Problems:    Pseudomonas aeruginosa colonization                Victoria Copeland is a 70 y.o. female that presented to [facility] with Bronchiectasis without complication (CMS-HCC).    1) Bronchiectasis exacerbation: Unclear etiology and has been undergoing outpatient evaluation for this.   PFTs demonstrated improvement.    - Continue ceftazidime -1/1-1/10  - completed tobramycin nebs  - Continue airway clearance with 3% saline nebs, albuterol, Aerobika  - Continue prednisone taper (30 mg x 5 days, 20 mg x 5 days and then 10 mg x 5 days  - Continue protonix while on prednisone    2) Hypothyroidism - Continue home levothyroxine    3) MS - Controlled off medications    4) Migraines, insomnia - continue amitriptyline, propranolol    5) VTE prophylaxis - Padua prediction score < 4 - continue to encourage ambulation    Estimated Date of Discharge: 03/03/19    In-home provider that assisted with visit was: Fleet Contras, RN    I was outside the hospital [facility]).  I spent 15 minutes on real-time audio and video with the patient for a patient visit.      The patient and/or parent/guardian has been advised of the potential risks and limitations of this mode of treatment (including, but not limited to, the absence of in-person examination) and has agreed to be treated using telemedicine. The  Patient's, patient's guardian's and/or patient's family's questions regarding telemedicine have been answered.     ___________________________________________________________________    Subjective:  Breathing has improved. Looking forward to discharge. No significant cough or fever.     Labs/Studies:    All lab results last 24 hours:      Objective:  Video quality: Good  Audio quality: Good    Temp:  [35.9 ??C (96.6 ??F)-36.9 ??C (98.4 ??F)] 36.9 ??C (98.4 ??F)  Heart Rate:  [51-60] 60  Resp: [16-18] 16  BP: (119-136)/(71-74) 119/74  SpO2:  [95 %-100 %] 98 %  Body mass index is 22.57 kg/m??.      HEENT: [place]/AT, EOMI, no scleral icterus, mucous membranes moist  Lungs: Respirations unlabored, no retractions or use of accessory muscles.  No audible cough .  Clear, no wheezing  [place]: benign  Extremities: No edema, no discoloration  Skin: No jaundice or petechiae.    Neurologic: Alert and oriented x3, speech clear, no facial droop, no tremor, moves all extremities

## 2020-04-07 ENCOUNTER — Ambulatory Visit: Admit: 2020-04-07 | Discharge: 2020-04-07 | Payer: MEDICARE

## 2020-04-07 ENCOUNTER — Ambulatory Visit
Admit: 2020-04-07 | Discharge: 2020-04-07 | Payer: MEDICARE | Attending: Student in an Organized Health Care Education/Training Program | Primary: Student in an Organized Health Care Education/Training Program

## 2020-04-07 DIAGNOSIS — J479 Bronchiectasis, uncomplicated: Principal | ICD-10-CM

## 2020-04-07 MED ORDER — SODIUM CHLORIDE 3 % FOR NEBULIZATION
Freq: Three times a day (TID) | RESPIRATORY_TRACT | 5 refills | 20.00000 days | Status: CP
Start: 2020-04-07 — End: ?

## 2020-04-07 MED ORDER — ALBUTEROL SULFATE CONCENTRATE 2.5 MG/0.5 ML SOLUTION FOR NEBULIZATION
Freq: Three times a day (TID) | RESPIRATORY_TRACT | 11 refills | 20 days | Status: CP
Start: 2020-04-07 — End: 2020-05-07

## 2020-04-07 NOTE — Unmapped (Addendum)
Continue with airway clearance up to 3 times a day     We will repeat PFTs today

## 2020-04-07 NOTE — Unmapped (Signed)
Pulmonary Clinic - Return Visit    Referring Physician :  Caryl Asp, FNP  PCP:     Victoria Asp, FNP  Reason for Consult:   Abnormal CT scan with pulmonary nodules, cough    ASSESSMENT and PLAN     Victoria Copeland is a 70 y.o. female with history of chronic rhinosinusitis, MS and GERD whom we are seeing for routine follow up of bronchiectasis.     # Bronchiectasis with bilateral Infiltrates, tree-in-bud nodules   - CT 01/2019  R>L bronchiectasis, more prominent in bilateral upper lobes and lingula/RML. Additionally, had bilateral solid and mixed nodules with some tree-in-bud. CT completed 08/2019 demonstrated overall  improvement in opacities, as well as improvement in FEV1 by spirometry.   - As far as etiology of bronchiectasis, unclear; no hypogammaglobulinemia, normal IgE, HIV negative, CF testing normal, alpha one testing ordered today   - Since 11/2019 patient has been having increase sx and decline in her PFTs    - Based on 01/05/2020 PFTs and fevers bronchoscopy was performed which showed PSA,  penicillium, and actinomyces   - Decision to treat with levaquin for 14 days and inhaled tobramycin for 28 days however  patient continued to have worsening sx and PFTs    - Patient admitted, IV ceftaz for 10 days, completed 30 day course of tobramycin  - Trailed prednisone course due to peripheral eos, felt improved but did have poor sleep and increased appetite. No longer on.   - CT 04/07/20 overall improved/stable and PFTs 02/26/20 improved after hospitalization, will repeat today to determine PFTs at patients symptom baseline   - At this time given how well patient has recovered, will hold off on bronch to confirm eradication of PSA as patient does not produce sputum   -Continue 3% HTS with aerobika, 7% caused oropharyngeal irritation, pre-med with albuterol   - If able to decrease airway clearance to BID, will consider adding on LAMA       # Health Maintenance  ?? Covid-19: S/p J&J 4/6/2, Wants to continue to wait on booster  ?? Influenza: scheduled for 04/2019 with pcp  ?? Pneumovax: Would recommend  unless has received in < 5 yrs 6 months from Prevnar vaccine which was given 11/2019 (due 05/2020)   ?? Prenvar: s/p 12/02/2018, 12/02/2019  ?? Tdap: UTD 12/02/2013  ?? Lung cancer screening: Not a candidate    This patient was seen and discussed with attending physician Dr. Okey Regal,  who agrees with the assessment and plan above.    CC: Victoria Asp, FNP    HISTORY:     Interval History   Patient is feeling much improved since her hospitalization. She has minimal shortness of breath. Much improved cough, still with VERY little sputum production despite doing airway clearance 3x a day. 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 She does report some tonsillar irritation ever since being on the tobra, describes it as s tickle in her throat.     History of Present Illness (02/12/2019):  [name] is a 70 y.o. female with history of chronic rhinosinusitis, MS and GERD whom we are seeing in consultation for evaluation of abnormal CT scan and cough. [name] reports she has had a cough for about the past year. Cough seems to wax and wane, although has become more persistent and irritating. Cough does seem to be worse with seasonal changes / allergy flares. Not worsened with PO intake  and no concern for aspiration. Has minimal GERD symptoms. Cough is often dry, but notes intermittently and more recently, she is able to cough up sputum with is thick yellow/green, sticky and foul-smelling. She notes she has chronic rhinosinusitis with obvious post-nasal drip; her therapy has recently been stepped up to steroid rinses for this. She does query if some of what she coughs up is this drainage. She reports she'd have mild-to-moderate SOB with heavier exertion, although no SOB with routine activities. She denies fevers, chills, sweats.    She notes given persistence and worsening of cough, a CXR was obtained which was abnormal, and led to a CT scan completed earlier this month which demonstrated multiple pulmonary nodules (see objective section below for interpretation). She was referred to [place] for further evaluation.     She does not smoke, but notes she has had significant second hand smoke exposure taking care of her mother over the years who was a heavy smoker. No inhaled substances. No known occupational exposures. Denies significant childhood illnesses, although notes for past several years, as above, has had recurrent sinus infections for which she follows with ENT. Notes she infrequently has chest colds, but has never had a severe respiratory illness. No known bronchiectasis in family. She has had multiple children. Family history notable for asthma and acute eosinophilic pneumonia of unclear etiology in her sister. Her mother had COPD associated with significant smoking. No family history of lung cancer, although this is a big concern for [name] given her second hand smoke exposure. No known history of rheumatologic diseases. She has MS, which she states has been slowly progressive, and she does not require any directed therapy for this (has been on no immunosuppression).    PMH: ?? Chronic rhinosinusitis / allergic rhinitis  ?? Hypothyroidism  ?? Migraines  ?? Multiple sclerosis -- slowly progressive, diagnosed 30 yrs ago, on no medications, no recent exacerbations  ?? Mitral valve Prolapse  ?? GERD, occasional     SurgHx: ?? Appendectomy  ?? Hysterectomy  ?? Tubal ligation     FH: ?? Father (deceased): Alzheimer's Disease, EtOH abuse, stroke  ?? Mother: COPD (assocated with smoking), Migraines, glaucoma, skin cancer  ?? Sister: Asthma, eosinophilic pneumonia, pan-sinusitis  ?? Maternal aunt: Ovarian cancer, throat cancer, pancreatic cancer  ?? MGM: Ovarian Cancer, DM     SocHx: ?? Tobacco: Never, although significant second hand exposure  ?? EtOH: Denies  ?? Drugs: Denies  ?? Living situation: Lives in [place], [place] built 2017, home treated for mold growth underneath house in crawl space a couple of years ago  ?? 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 Pets: None     Meds: Personally reviewed in Epic. Pertinent pulmonary meds:  ?? Budesonide - nasal/sinus rinses     Allergies: Allergies   Allergen Reactions   ??? Doxycycline Nausea Only   ??? Erythromycin Nausea Only   ??? Sulfa (Sulfonamide Antibiotics) Rash      ROS: 12-point ROS reviewed with patient with pertinent positives and negatives as per HPI.       PHYSICAL EXAM:     Vitals:    04/07/20 1118   BP: 100/76   Pulse: 58   Resp: 16   Temp: 36.2 ??C   SpO2: 97%     General: No acute distress   Eyes: EOMI, sclera anicteric  HENT: clear oropharynx, tonsils non-inflammed  CV: RRR, no m/r/g  Lungs: Normal work of breathing  without accessory muscle usage, CTAB with good air movement bilaterally without wheezes or crackles  Abd: ND  Ext: no clubbing, no cyanosis  Skin: No rashes  Neuro: No focal deficits, alert & oriented    LABORATORY and RADIOLOGY DATA:     PFTs        Pertinent Laboratory Data:  Reviewed     Pertinent Imaging Data:  CT 04/2019:  IMPRESSION:   Interval improvement of patchy and nodular pulmonary parenchymal opacities with residual nodular opacities. Lingular bronchiectasis. Findings are most likely representing endobronchial infection such as MAI. Recommend 3-6 month follow-up chest CT for further evaluation.    CT 08/2019  IMPRESSION:  Persistent bronchiectasis, small airway inflammation and associated peripheral respiratory bronchiolitis, with mild increase in the bronchiolitis in the peripheral right lower lobe. 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    CT 12/2019  IMPRESSION:  ??Mild lingular bronchiectasis, and clustered nodular pulmonary parenchymal opacities most likely representing indolent small airway infection. Overall the appearance is grossly unchanged from prior studies.    CT 2/22  IMPRESSION:  Unchanged bronchiectasis within the lingula and medial right middle lobe as well as multiple clusters of centrilobular pulmonary nodules, which can be seen in the setting of indolent small airway infection.

## 2020-04-22 NOTE — Unmapped (Signed)
Faxed precriber's order to [facility] [phone].  Fax confirmation received at 9:11am

## 2020-04-23 ENCOUNTER — Ambulatory Visit: Admit: 2020-04-23 | Discharge: 2020-04-24 | Payer: MEDICARE

## 2020-04-23 DIAGNOSIS — J471 Bronchiectasis with (acute) exacerbation: Principal | ICD-10-CM

## 2020-04-23 DIAGNOSIS — J479 Bronchiectasis, uncomplicated: Principal | ICD-10-CM

## 2020-04-23 MED ORDER — LEVOFLOXACIN 750 MG TABLET
ORAL_TABLET | Freq: Every day | ORAL | 0 refills | 14.00000 days | Status: CP
Start: 2020-04-23 — End: 2020-05-07

## 2020-04-23 NOTE — Unmapped (Signed)
Met with patient in clinic today and gave her some sputum cups to take home.  We talked about different strategies she can use to try to produce some sputum at home.  Hopefully something will be successful.  She will call me if she has any questions or concerns.

## 2020-04-28 ENCOUNTER — Ambulatory Visit: Admit: 2020-04-28 | Discharge: 2020-04-29 | Payer: MEDICARE

## 2020-04-29 NOTE — Unmapped (Signed)
Faxed prescriber's order to [facility] AT [phone]  Fax confirmation received at 10:24am

## 2020-05-04 ENCOUNTER — Other Ambulatory Visit: Payer: Self-pay | Admitting: Nurse Practitioner

## 2020-05-04 DIAGNOSIS — Z1231 Encounter for screening mammogram for malignant neoplasm of breast: Secondary | ICD-10-CM

## 2020-05-19 ENCOUNTER — Other Ambulatory Visit: Payer: Self-pay

## 2020-05-19 ENCOUNTER — Ambulatory Visit: Payer: Medicare Other

## 2020-05-19 ENCOUNTER — Ambulatory Visit
Admission: RE | Admit: 2020-05-19 | Discharge: 2020-05-19 | Disposition: A | Discharge status: Home / Self Care | Payer: Medicare Other | Source: Ambulatory Visit | Attending: Nurse Practitioner | Admitting: Nurse Practitioner

## 2020-05-19 ENCOUNTER — Ambulatory Visit: Admit: 2020-05-19 | Discharge: 2020-05-20 | Payer: MEDICARE

## 2020-05-19 DIAGNOSIS — Z1231 Encounter for screening mammogram for malignant neoplasm of breast: Secondary | ICD-10-CM | POA: Insufficient documentation

## 2020-05-19 DIAGNOSIS — J479 Bronchiectasis, uncomplicated: Principal | ICD-10-CM

## 2020-05-19 NOTE — Unmapped (Signed)
Received voicemail from Victoria Copeland. She wanted to notify us that she had been able to submit a sputum culture today.     Do see sputum cultures pending for AFB and lower respiratory culture in our system.     Victoria Copeland is reporting some thick green sputum, but not reporting any other significant respiratory symptoms at this time.     Let her know that I would notify Dr. Marlana Salvage and we will be on look our for results.

## 2020-06-03 NOTE — Unmapped (Signed)
Faxed medical equipment order to [facility] [phone].  Fax confirmation received at 2:31pm

## 2020-06-16 NOTE — Unmapped (Signed)
Called patient to see how her airway clearance is going.  She hasn't been able to produce a sample for processing.  She says that she is only doing her airway clearance twice a day now and feels that is enough.  I let her know that she can skip one evening clearance to hopefully make it easier to get a larger morning sample.  She says she will give that a try.  No further questions at this time.

## 2020-06-23 NOTE — Unmapped (Signed)
Spoke to [facility] today regarding nebulizer supply order form.    Provided them with the correct provider information and confirmed the way the order should be completed and signed.     Received corrected forms and Dr. Marlana Salvage signed orders today.     Faxed back to [facility] and signed order scanned into [employer].    Fax: [phone]; confirmation received.

## 2020-07-08 NOTE — Unmapped (Signed)
Provider schedule change. Left message for pt to call us back. Already rescheduled appt for 08/29

## 2020-07-20 NOTE — Unmapped (Signed)
Please see phone note from 07/20/20.

## 2020-07-20 NOTE — Unmapped (Signed)
Received following message from Ms. WalkerAurther Loft,   Still unable to cough up a sample and seemed to have been doing better the last month or so. ??FYI, I have tested positive for Covid and I'm on my 4th day of symptoms(very much like a cold with fever in the afternoons/night, never over 101). ??Should I be taking any preventative meds? I am nebulizing to help keep airways ??open and am up and around for the most part. ??My blood O2 has been okay (upper 90's). ??Just thought I would check in to you know! Thanks, Raynelle Fanning     Phoned Ms. Kynard. Denies any shortness of breath. Symptoms include sneezing, runny nose, some cough and slight loss of taste and smell.     She has had 1 dose of the [employer] vaccine.     Has only taken tylenol once a day and her fever have been more in the evening    Her symptoms did not appear to be worsening at this time. She is moving about her house and continues to do her nebulizer treatments twice a day.    Let Ms Lawley know that I would relay this to Dr. Marlana Salvage. Discussed that there is a preventative medication but she is nearing the window for taking it.     Reports that there is an increase in COVID-19 cases in her community as well.

## 2020-07-21 DIAGNOSIS — U071 COVID-19: Principal | ICD-10-CM

## 2020-07-21 MED ORDER — PAXLOVID 150 MG-100 MG TABLET (EUA)
ORAL_TABLET | 0 refills | 0 days | Status: CP
Start: 2020-07-21 — End: ?
  Filled 2020-07-21: qty 20, 5d supply, fill #0

## 2020-07-21 NOTE — Unmapped (Signed)
Ms. Fauth tested positive 5/30 for COVID and had sx 48 hours before this. She has been febrile the past several days. She denies a cough or body aches. Mildly sore throat and congested. She has not felt febrile today. Given patients underlying lung disease and risk for progressing to hospitalization or severe disease and her symptoms will order Paxlovid to [facility] to be started today. Patient will start today given she is nearing the cutoff of benefit (within 5 days). No conflicting medications (patient is not using dexamethasone mouth wash) and most recent GFR 40. Discussed with pharmacy and patient. All questions answered.

## 2020-07-22 NOTE — Unmapped (Signed)
Spoke to Victoria Copeland today. She picked up the Paxlovid and started it last evening.    She sounded well and will call with any issues or concerns.

## 2020-08-13 NOTE — Unmapped (Signed)
Faxed prescribers order to [facility]  [phone]   Fax confirmation received at 3:08pm

## 2020-08-23 IMAGING — CT CT CHEST W/O CM
1 of 2 series · 14 of 32 positions shown, 18 images · non-contrast
Comparison: Chest radiographs report dated 01/27/2019

CLINICAL DATA: Cough for the past 9 months. 10 mm nodular opacity
in the left mid lung on chest radiographs dated 01/27/2019 at
[REDACTED]. Small, ill-defined right upper lobe infiltrate on
the radiographs. Those radiographs are not available for direct
comparison.

EXAM:
CT CHEST WITHOUT CONTRAST
TECHNIQUE: Multidetector CT imaging of the chest was performed following the
standard protocol without IV contrast.

[Series 2: thorax · axial · 0.61mm/px · z∈[-652,-398]mm · 14 of 151 slices shown, 18 images]
[im 12/151  mediastinal]
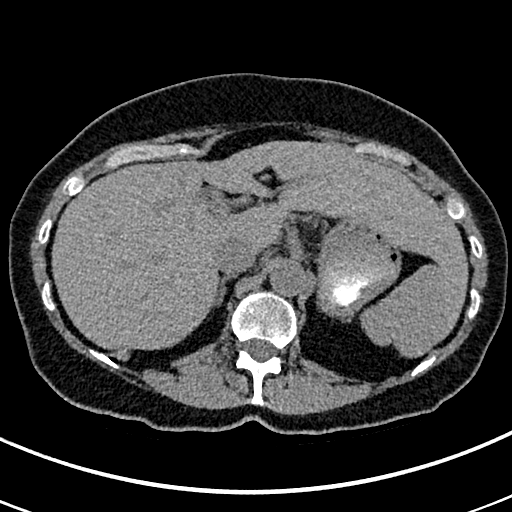
[im 12/151  lung]
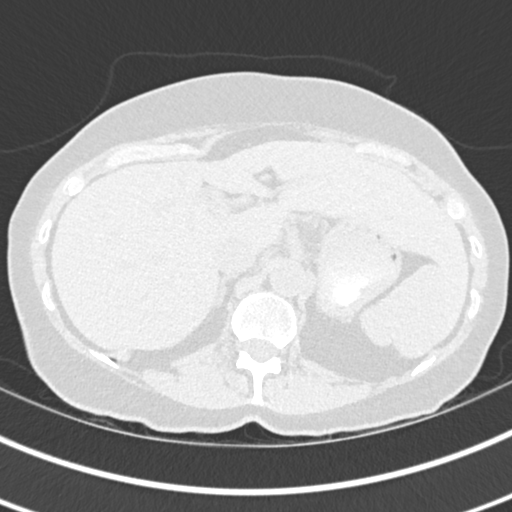
[im 24/151  lung]
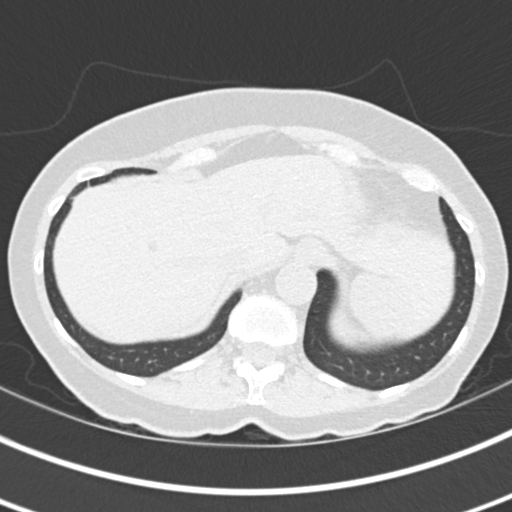
[im 35/151  lung]
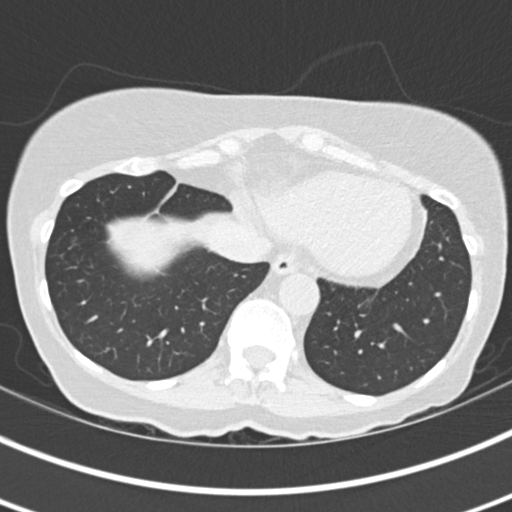
[im 47/151  lung]
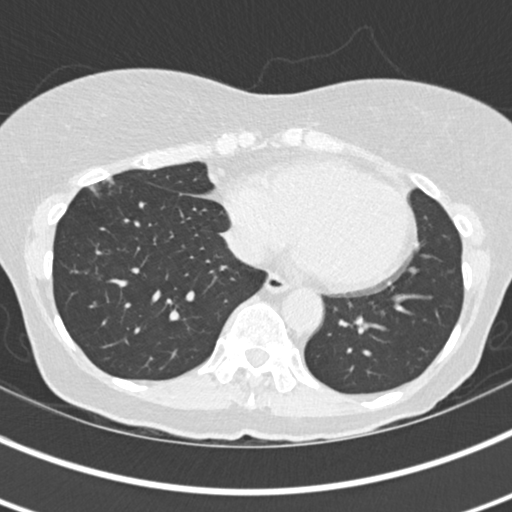
[im 58/151  mediastinal]
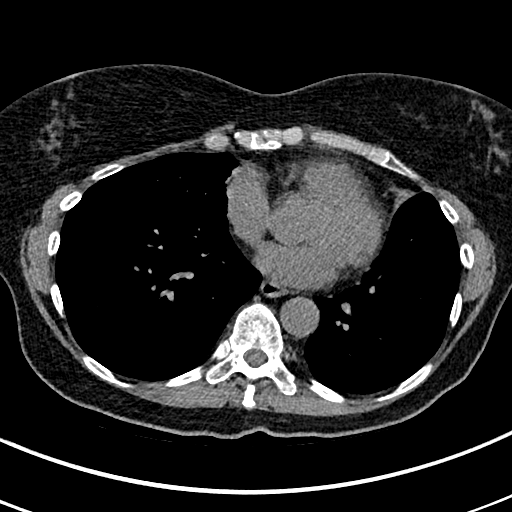
[im 58/151  lung]
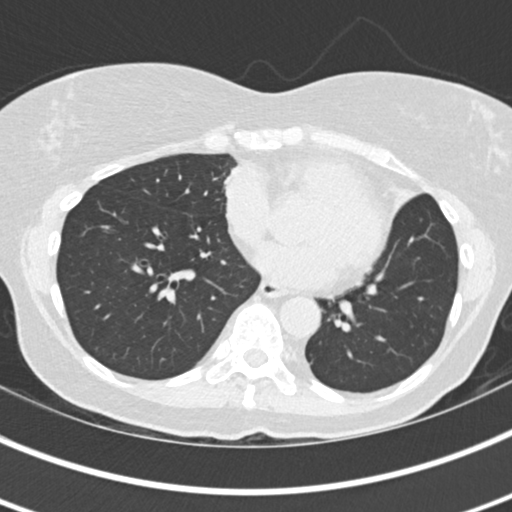
[im 70/151  lung]
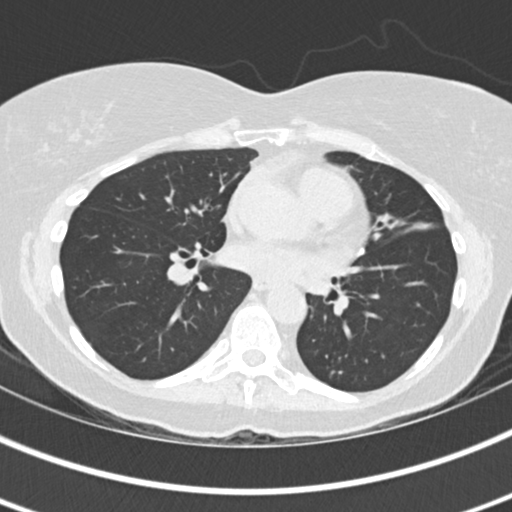
[im 71/151  lung]
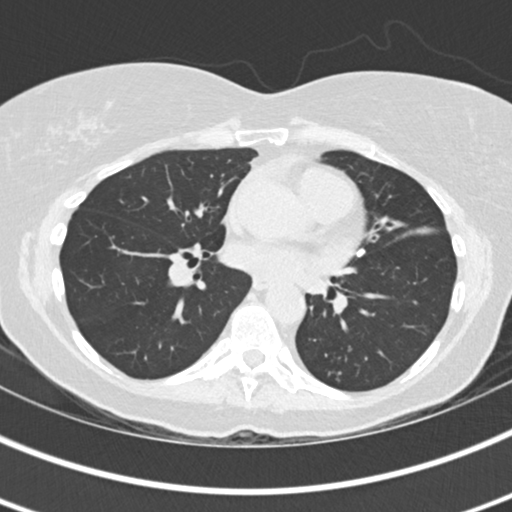
[im 76/151  lung]
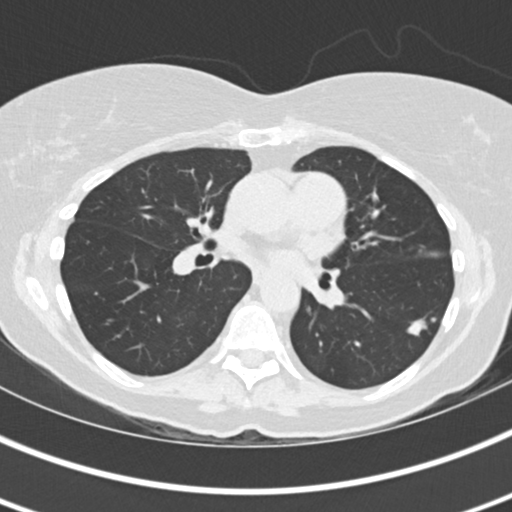
[im 81/151  mediastinal]
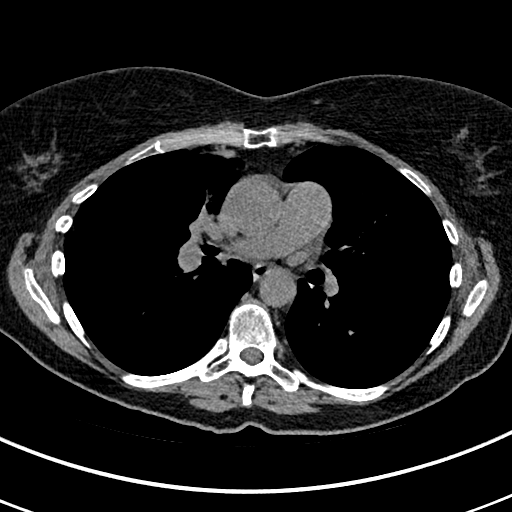
[im 81/151  lung]
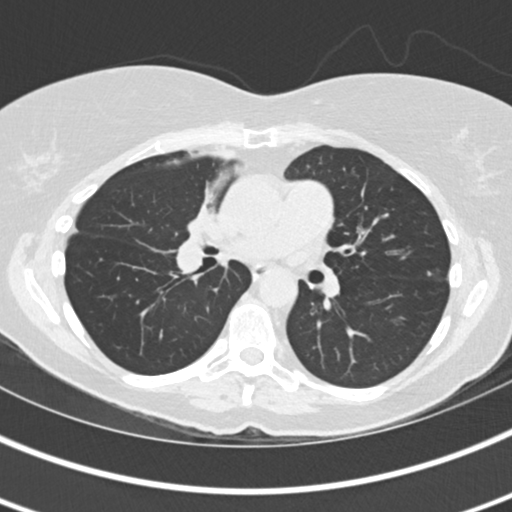
[im 93/151  lung]
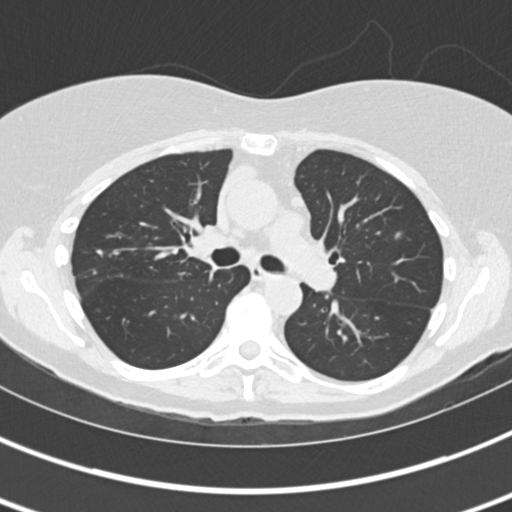
[im 104/151  lung]
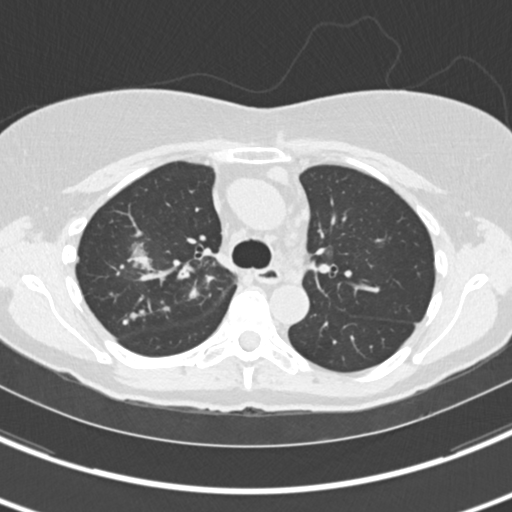
[im 116/151  lung]
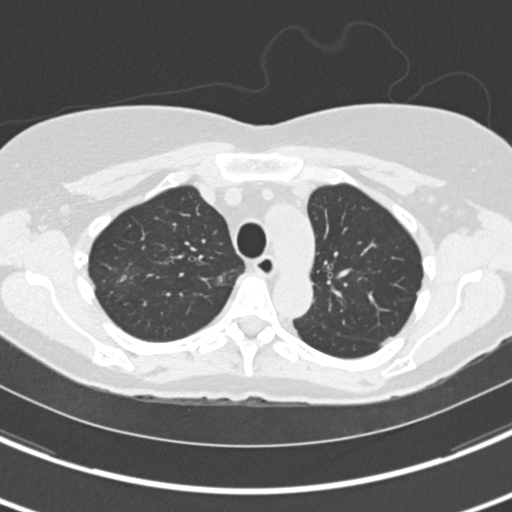
[im 127/151  mediastinal]
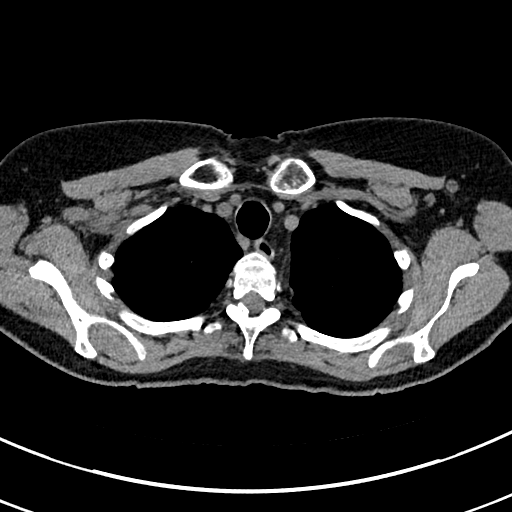
[im 127/151  lung]
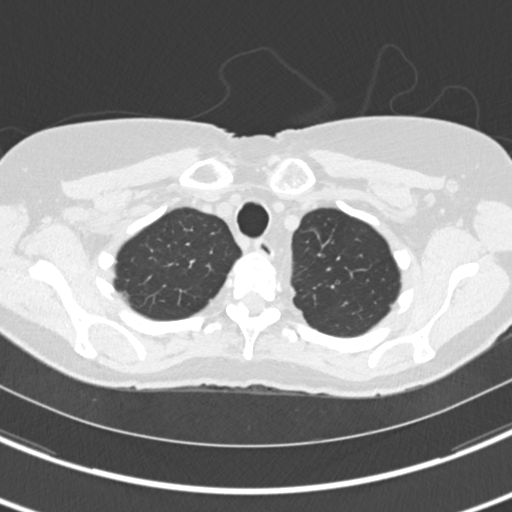
[im 139/151  lung]
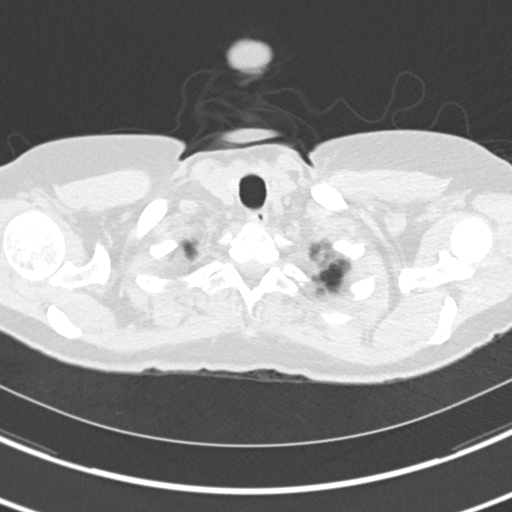

[14 of 32 positions shown; findings below may reference images not displayed]

FINDINGS: Cardiovascular: No significant vascular findings. Normal heart size.
No pericardial effusion.

Mediastinum/Nodes: No enlarged mediastinal or axillary lymph nodes.
Thyroid gland, trachea, and esophagus demonstrate no significant
findings.

Lungs/Pleura: 12 x 7 mm nodule in the left lower lobe laterally on
image number 76 series 4 with multiple additional adjacent smaller
nodules.

21 x 9 mm subsolid nodule in the right upper lobe on image number 48
series [DATE] x 5 mm irregular nodule in the right upper lobe on image number
53 series 4.

Multiple additional smaller nodules in both lungs, most numerous in
the left lower lobe posteriorly.

No pleural fluid.

Upper Abdomen: Small right lobe liver cyst.

Musculoskeletal: Mild thoracic and lower cervical spine degenerative
changes.
IMPRESSION: Multiple bilateral lung nodules, as described above. The largest is
in the right upper lobe and is a subsolid nodule measuring 21 x 9 mm
with additional smaller subsolid nodules in the right upper lobe and
solid nodules elsewhere in both lungs. Non-contrast chest CT at 3-6
months is recommended. If the nodules are stable at time of repeat
CT, then future CT at 18-24 months (from today's scan) is considered
optional for low-risk patients, but is recommended for high-risk
patients. This recommendation follows the consensus statement:
Guidelines for Management of Incidental Pulmonary Nodules Detected

## 2020-10-18 ENCOUNTER — Ambulatory Visit
Admit: 2020-10-18 | Discharge: 2020-10-19 | Payer: MEDICARE | Attending: Student in an Organized Health Care Education/Training Program | Primary: Student in an Organized Health Care Education/Training Program

## 2020-10-18 DIAGNOSIS — J479 Bronchiectasis, uncomplicated: Principal | ICD-10-CM

## 2020-10-18 NOTE — Unmapped (Signed)
Pulmonary Clinic - Return Visit    Referring Physician :  Caryl Asp, FNP  PCP:     Caryl Asp, FNP  Reason for Consult:   Abnormal CT scan with pulmonary nodules, cough    ASSESSMENT and PLAN     Victoria Copeland is a 70 y.o. female with history of chronic rhinosinusitis, MS and GERD whom we are seeing for routine follow up of bronchiectasis.     # Bronchiectasis with bilateral Infiltrates, tree-in-bud nodules   - CT 01/2019  R>L bronchiectasis, more prominent in bilateral upper lobes and lingula/RML. Additionally, had bilateral solid and mixed nodules with some tree-in-bud. CT completed 08/2019 demonstrated overall  improvement in opacities, as well as improvement in FEV1 by spirometry.   - As far as etiology of bronchiectasis, unclear; no hypogammaglobulinemia, normal IgE, HIV negative, CF testing normal, alpha one testing normal  - Based on 01/05/2020 PFTs and fevers bronchoscopy was performed which showed PSA, penicillium, and actinomyces   - Decision to treat with levaquin for 14 days and inhaled tobramycin for 28  days however patient continued to have worsening sx and PFTs    - Patient admitted, IV ceftaz for 10 days, completed 30 day course of  tobramycin  - Completed trial of prednisone course due to peripheral eos, felt improved but did have poor sleep and increased appetite. No longer on.   - CT 04/07/20 overall improved/stable and PFTs 02/26/20 improved after hospitalization  - On 09/2020 has been treated once with Levaquin since hospitalization due to infectious sx   - At this time given how well patient has recovered, will hold off on bronch to confirm eradication of PSA as patient does not produce sputum   - Patient received a percussive vest from [facility] she will use this BID while she is not exacerbating as she prefers it to the nebulizer  -Will add 3% HTS with aerobika at first sx of congestion or exacerbation, 7% caused oropharyngeal irritation, pre-med with albuterol   - FVL ordered for FU visit # Health Maintenance  ?? Covid-19: S/p J&J 4/6/2, Wants to continue to wait on booster  ?? Influenza: scheduled for 04/2019 with pcp  ?? Pneumovax: Would recommend  unless has received in < 5 yrs. Prevnar vaccine which was given 11/2019 (due 05/2020)   ?? Prenvar: s/p 12/02/2018, 12/02/2019  ?? Tdap: UTD 12/02/2013  ?? Lung cancer screening: Not a candidate    This patient was seen and discussed with attending physician Dr. Nada Libman,  who agrees with the assessment and plan above.    CC: Caryl Asp, FNP    HISTORY:     Interval History   Patient is feeling much improved since her hospitalization. She has minimal shortness of breath. Much improved cough, still with VERY little sputum production despite doing airway clearance 3x a day. 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 She does report some tonsillar irritation ever since being on the tobra, describes it as s tickle in her throat.     History of Present Illness (02/12/2019):  Victoria Copeland is a 70 y.o. female with history of chronic rhinosinusitis, MS and GERD whom we are seeing in consultation for evaluation of abnormal CT scan and cough. Victoria Copeland reports she has had a cough for about the past year. Cough seems to wax and wane, although has become more persistent and irritating. Cough does seem to be worse with seasonal changes / allergy  flares. Not worsened with PO intake and no concern for aspiration. Has minimal GERD symptoms. Cough is often dry, but notes intermittently and more recently, she is able to cough up sputum with is thick yellow/green, sticky and foul-smelling. She notes she has chronic rhinosinusitis with obvious post-nasal drip; her therapy has recently been stepped up to steroid rinses for this. She does query if some of what she coughs up is this drainage. She reports she'd have mild-to-moderate SOB with heavier exertion, although no SOB with routine activities. She denies fevers, chills, sweats.    She notes given persistence and worsening of cough, a CXR was obtained which was abnormal, and led to a CT scan completed earlier this month which demonstrated multiple pulmonary nodules (see objective section below for interpretation). She was referred to [place] for further evaluation.     She does not smoke, but notes she has had significant second hand smoke exposure taking care of her mother over the years who was a heavy smoker. No inhaled substances. No known occupational exposures. Denies significant childhood illnesses, although notes for past several years, as above, has had recurrent sinus infections for which she follows with ENT. Notes she infrequently has chest colds, but has never had a severe respiratory illness. No known bronchiectasis in family. She has had multiple children. Family history notable for asthma and acute eosinophilic pneumonia of unclear etiology in her sister. Her mother had COPD associated with significant smoking. No family history of lung cancer, although this is a big concern for Victoria Copeland given her second hand smoke exposure. No known history of rheumatologic diseases. She has MS, which she states has been slowly progressive, and she does not require any directed therapy for this (has been on no immunosuppression).    Interval Update 10/18/20  Since her last visit in clinic Victoria Copeland completed one course of levaquin for cough and congestion. She was also treated for COVID with paxlovid with only mild sx. She has otherwise been doing well. She received a percussive vest from [facility] and has been using this in place of her nebulizer which she much prefers. Still not producing much mucus. No fevers. Able to sing in chorus.     PMH: ?? Chronic rhinosinusitis / allergic rhinitis  ?? Hypothyroidism  ?? Migraines  ?? Multiple sclerosis -- slowly progressive, diagnosed 30 yrs ago, on no medications, no recent exacerbations  ?? Mitral valve Prolapse  ?? GERD, occasional     SurgHx: ?? Appendectomy  ?? Hysterectomy  ?? Tubal ligation     FH: ?? Father (deceased): Alzheimer's Disease, EtOH abuse, stroke  ?? Mother: COPD (assocated with smoking), Migraines, glaucoma, skin cancer  ?? Sister: Asthma, eosinophilic pneumonia, pan-sinusitis  ?? Maternal aunt: Ovarian cancer, throat cancer, pancreatic cancer  ?? MGM: Ovarian Cancer, DM     SocHx: ?? Tobacco: Never, although significant second hand exposure  ?? EtOH: Denies  ?? Drugs: Denies  ?? Living situation: Lives in [place] [address], [place] built 2017, home treated for mold growth underneath house in crawl space a couple of years ago  ?? 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 Pets: None     Meds: Personally reviewed in Epic. Pertinent pulmonary meds:  ?? Budesonide - nasal/sinus rinses     Allergies: Allergies   Allergen Reactions   ??? Doxycycline Nausea Only   ??? Erythromycin Nausea Only   ??? Sulfa (Sulfonamide Antibiotics) Rash      ROS: 12-point  ROS reviewed with patient with pertinent positives and negatives as per HPI.       PHYSICAL EXAM:     Vitals:    10/18/20 1119   BP: 103/68   Pulse: 68   Temp: 36.1 ??C (97 ??F)   SpO2: 99%   General: No acute distress   Eyes: EOMI, sclera anicteric  HENT: clear oropharynx, tonsils non-inflammed  CV: RRR, no m/r/g  Lungs: Normal work of breathing without accessory muscle usage, CTAB with good air movement bilaterally without wheezes or crackles  Abd: ND  Ext: no clubbing, no cyanosis  Skin: No rashes  Neuro: No focal deficits, alert & oriented    LABORATORY and RADIOLOGY DATA:     PFTs        Pertinent Laboratory Data:  Reviewed     Pertinent Imaging Data:  CT 04/2019:  IMPRESSION:   Interval improvement of patchy and nodular pulmonary parenchymal opacities with residual nodular opacities. Lingular bronchiectasis. Findings are most likely representing endobronchial infection such as MAI. Recommend 3-6 month follow-up chest CT for further evaluation.    CT 08/2019  IMPRESSION:  Persistent bronchiectasis, small airway inflammation and associated peripheral respiratory bronchiolitis, with mild increase in the bronchiolitis in the peripheral right lower lobe. 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    CT 12/2019  IMPRESSION:  ??Mild lingular bronchiectasis, and clustered nodular pulmonary parenchymal opacities most likely representing indolent small airway infection. Overall the appearance is grossly unchanged from prior studies.    CT 2/22  IMPRESSION:  Unchanged bronchiectasis within the lingula and medial right middle lobe as well as multiple clusters of centrilobular pulmonary nodules, which can be seen in the setting of indolent small airway infection.

## 2020-10-18 NOTE — Unmapped (Signed)
Thank you for allowing me to be a part of your care. Please call the clinic with any questions.    Dahlia Byes, MD     Between appointments, you can reach [place] at these numbers:    For appointments or the Pulmonary Nurse: [phone], Fax: [phone]  For emergency issues after hours: Hospital Operator: [phone], ask for Pulmonary Fellow on call    Important Links:  How to use inhaler videos: COPD Inhaler Educational Video Series - [employer] Foundation   Pulmonary rehab: Pulmonary Rehabilitation - Department of Medicine ([email or web address]   Smoking cessation: Smoking Cessation - Department of Medicine ([email or web address]

## 2020-10-18 NOTE — Unmapped (Signed)
Patient has gotten a vest from [facility].  She had a few questions about using it.  We discussed using it twice a day.  If she has issues with the fit she will contact [facility] to help.  No further questions at this time.

## 2020-12-15 DIAGNOSIS — J471 Bronchiectasis with (acute) exacerbation: Principal | ICD-10-CM

## 2020-12-15 MED ORDER — LEVOFLOXACIN 750 MG TABLET
ORAL_TABLET | Freq: Every day | ORAL | 0 refills | 7.00000 days | Status: CP
Start: 2020-12-15 — End: 2020-12-22

## 2021-01-17 DIAGNOSIS — J479 Bronchiectasis, uncomplicated: Principal | ICD-10-CM

## 2021-01-21 ENCOUNTER — Ambulatory Visit: Admit: 2021-01-21 | Discharge: 2021-01-22 | Payer: MEDICARE

## 2021-01-21 NOTE — Unmapped (Signed)
Called Victoria Copeland to see how she has been feeling. She let me know that after 2 weeks of levaquin she began to feel better.  She then got a cold during which she had a cough with increase sputum and congestion. Her sx have overall improved and CXR is without infiltrate. She was doing albuterol and HTS (3%) 2-3 times a day during the peak of her symptoms and has returned to daily. She is overall feeling much improved. She got her flu shot but is not interested in the COVID booster at this time. We discussed wearing a mask in crowds. She will let me know if she develops recurrent symptoms.

## 2021-04-02 NOTE — Unmapped (Signed)
Pulmonary Clinic - Return Visit    Referring Physician :  Caryl Asp, FNP  PCP:     Caryl Asp, FNP  Reason for Consult:   Abnormal CT scan with pulmonary nodules, cough    ASSESSMENT and PLAN     Victoria Copeland is a 71 y.o. female with history of chronic rhinosinusitis, MS and GERD whom we are seeing for routine follow up of bronchiectasis.     # Bronchiectasis with bilateral Infiltrates, tree-in-bud nodules   - CT 01/2019  R>L bronchiectasis, more prominent in bilateral upper lobes and lingula/RML. Additionally, had bilateral solid and mixed nodules with some tree-in-bud. CT completed 08/2019 demonstrated overall  improvement in opacities, as well as improvement in FEV1 by spirometry.   - As far as etiology of bronchiectasis, unclear; no hypogammaglobulinemia, normal IgE, HIV negative, CF testing normal, alpha one testing normal  - Based on 01/05/2020 PFTs and fevers bronchoscopy was performed which showed PSA, penicillium, and actinomyces   - Decision to treat with levaquin for 14 days and inhaled tobramycin for 28 days however patient continued to have worsening sx and PFTs    - Patient admitted, IV ceftaz for 10 days, completed 30 day course of tobramycin  - Completed trial of prednisone course due to peripheral eos, felt improved but did have poor sleep and increased appetite. No longer on.   - CT 04/07/20 overall improved/stable and PFTs 02/26/20 improved after hospitalization  - On 09/2020 has been treated once with Levaquin since hospitalization due to infectious sx   - At this time given how well patient has recovered, will hold off on bronch to confirm eradication of PSA as patient does not produce sputum   - Patient received a percussive vest from [facility] somehow, I did not request this initially, and she does not need to continue use as she has other forms of airway clearance. Will reach out to [facility].    -Will add 3% HTS with aerobika at first sx of congestion or exacerbation, 7% caused oropharyngeal irritation, pre-med with albuterol   - FVL 2/13 best they have been in the last year, will continue current regimen    - Of note, she will be going to [place] in May, if has sx will reach out for abx prior to trip     # Health Maintenance  ?? Covid-19: S/p J&J 4/6/2, Wants to continue to wait on booster  ?? Influenza: scheduled for 04/2019 with pcp  ?? Pneumovax: Would recommend  unless has received in < 5 yrs. Prevnar vaccine which was given 11/2019 (due 05/2020)   ?? Prenvar: s/p 12/02/2018, 12/02/2019  ?? Tdap: UTD 12/02/2013  ?? Lung cancer screening: Not a candidate  ?? ** Due to PCV but she wants to check on immunizations with [facility] where she gets them     Immunization History   Administered Date(s) Administered   ??? COVID-19 VACC,(JANSSEN)(PF) 05/27/2019   ??? INFLUENZA INJ MDCK PF, QUAD,(FLUCELVAX)(215MO AND UP EGG FREE) 12/27/2015, 01/03/2018, 01/27/2019   ??? INFLUENZA TIV (TRI) 215MO+ W/ PRESERV (IM) 12/02/2013, 12/08/2014   ??? Influenza Virus Vaccine, unspecified formulation 12/03/2012   ??? Influenza,Injectable, MDCK, QUAD w/preservative 01/02/2017   ??? Pneumococcal Conjugate 13-Valent 12/02/2018, 12/02/2019   ??? TdaP 12/02/2013       This patient was seen and discussed with attending physician Dr. Curley Spice,  who agrees with the assessment and plan above.    CC: Caryl Asp, FNP    HISTORY:     Interval  History   Patient is feeling much improved since her hospitalization. She has minimal shortness of breath. Much improved cough, still with VERY little sputum production despite doing airway clearance 3x a day. 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 She does report some tonsillar irritation ever since being on the tobra, describes it as s tickle in her throat.     History of Present Illness (02/12/2019):  Victoria Copeland is a 71 y.o. female with history of chronic rhinosinusitis, MS and GERD whom we are seeing in consultation for evaluation of abnormal CT scan and cough. Victoria Copeland reports she has had a cough for about the past year. Cough seems to wax and wane, although has become more persistent and irritating. Cough does seem to be worse with seasonal changes / allergy flares. Not worsened with PO intake and no concern for aspiration. Has minimal GERD symptoms. Cough is often dry, but notes intermittently and more recently, she is able to cough up sputum with is thick yellow/green, sticky and foul-smelling. She notes she has chronic rhinosinusitis with obvious post-nasal drip; her therapy has recently been stepped up to steroid rinses for this. She does query if some of what she coughs up is this drainage. She reports she'd have mild-to-moderate SOB with heavier exertion, although no SOB with routine activities. She denies fevers, chills, sweats.    She notes given persistence and worsening of cough, a CXR was obtained which was abnormal, and led to a CT scan completed earlier this month which demonstrated multiple pulmonary nodules (see objective section below for interpretation). She was referred to [place] for further evaluation.     She does not smoke, but notes she has had significant second hand smoke exposure taking care of her mother over the years who was a heavy smoker. No inhaled substances. No known occupational exposures. Denies significant childhood illnesses, although notes for past several years, as above, has had recurrent sinus infections for which she follows with ENT. Notes she infrequently has chest colds, but has never had a severe respiratory illness. No known bronchiectasis in family. She has had multiple children. Family history notable for asthma and acute eosinophilic pneumonia of unclear etiology in her sister. Her mother had COPD associated with significant smoking. No family history of lung cancer, although this is a big concern for Ms. Davidovich given her second hand smoke exposure. No known history of rheumatologic diseases. She has MS, which she states has been slowly progressive, and she does not require any directed therapy for this (has been on no immunosuppression).    Interval Update 10/18/20  Since her last visit in clinic Ms. Yokoyama completed one course of levaquin for cough and congestion. She was also treated for COVID with paxlovid with only mild sx. She has otherwise been doing well. She received a percussive vest from [employer] and has been using this in place of her nebulizer which she much prefers. Still not producing much mucus. No fevers. Able to sing in chorus.     Interval Update 04/04/21  Ms. Morici has been feeling great. She does huff cough daily, she also has a nebulizer and will do albuterol and 3% saline via [place] if she has a cold or notices more sputum. Denies any other complaints. No sputum production and stable cough. Able to sing in choir.       PMH: ?? Chronic rhinosinusitis / allergic rhinitis  ?? Hypothyroidism  ?? Migraines  ?? Multiple  sclerosis -- slowly progressive, diagnosed 30 yrs ago, on no medications, no recent exacerbations  ?? Mitral valve Prolapse  ?? GERD, occasional     SurgHx: ?? Appendectomy  ?? Hysterectomy  ?? Tubal ligation     FH: ?? Father (deceased): Alzheimer's Disease, EtOH abuse, stroke  ?? Mother: COPD (assocated with smoking), Migraines, glaucoma, skin cancer  ?? Sister: Asthma, eosinophilic pneumonia, pan-sinusitis  ?? Maternal aunt: Ovarian cancer, throat cancer, pancreatic cancer  ?? MGM: Ovarian Cancer, DM     SocHx: ?? Tobacco: Never, although significant second hand exposure  ?? EtOH: Denies  ?? Drugs: Denies  ?? Living situation: Lives in [place], [place] built [place], home treated for mold growth underneath house in crawl space a couple of years ago  ?? 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 Pets: None     Meds: Personally reviewed in Epic. Pertinent pulmonary meds:  ?? Budesonide - nasal/sinus rinses     Allergies: Allergies   Allergen Reactions   ??? Doxycycline Nausea Only   ??? Erythromycin Nausea Only   ??? Sulfa (Sulfonamide Antibiotics) Rash      ROS: 12-point ROS reviewed with patient with pertinent positives and negatives as per HPI.       PHYSICAL EXAM:     Vitals:    04/04/21 1059   BP: 122/72   Pulse: 53   Resp: 16   Temp: 36.2 ??C (97.2 ??F)   SpO2: 95%       General: No acute distress   Eyes: EOMI, sclera anicteric  CV: RRR, no m/r/g  Lungs: Normal work of breathing without accessory muscle usage, CTAB with good air movement bilaterally without wheezes or crackles  Abd: ND  Ext: no clubbing, no cyanosis  Skin: No rashes  Neuro: No focal deficits, alert & oriented    LABORATORY and RADIOLOGY DATA:     PFTs        Pertinent Laboratory Data:  Reviewed     Pertinent Imaging Data:  CT 04/2019:  IMPRESSION:   Interval improvement of patchy and nodular pulmonary parenchymal opacities with residual nodular opacities. Lingular bronchiectasis. Findings are most likely representing endobronchial infection such as MAI. Recommend 3-6 month follow-up chest CT for further evaluation.    CT 08/2019  IMPRESSION:  Persistent bronchiectasis, small airway inflammation and associated peripheral respiratory bronchiolitis, with mild increase in the bronchiolitis in the peripheral right lower lobe. 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    CT 12/2019  IMPRESSION:  ??Mild lingular bronchiectasis, and clustered nodular pulmonary parenchymal opacities most likely representing indolent small airway infection. Overall the appearance is grossly unchanged from prior studies.    CT 2/22  IMPRESSION:  Unchanged bronchiectasis within the lingula and medial right middle lobe as well as multiple clusters of centrilobular pulmonary nodules, which can be seen in the setting of indolent small airway infection.

## 2021-04-04 ENCOUNTER — Ambulatory Visit
Admit: 2021-04-04 | Discharge: 2021-04-04 | Payer: MEDICARE | Attending: Student in an Organized Health Care Education/Training Program | Primary: Student in an Organized Health Care Education/Training Program

## 2021-04-04 ENCOUNTER — Ambulatory Visit: Admit: 2021-04-04 | Discharge: 2021-04-04 | Payer: MEDICARE

## 2021-04-04 DIAGNOSIS — J479 Bronchiectasis, uncomplicated: Principal | ICD-10-CM

## 2021-04-04 NOTE — Unmapped (Signed)
Keep up the good work!     Thank you for allowing me to be a part of your care. Please call the clinic with any questions.    Dahlia Byes, MD     Between appointments, you can reach [place] at these numbers:    For appointments or the Pulmonary Nurse: [phone], Fax: [phone]  For emergency issues after hours: Hospital Operator: [phone], ask for Pulmonary Fellow on call  [facility]: [phone]  Pharmacy Assistance Program: [phone]    Important Links:  How to use inhaler videos: COPD Inhaler Educational Video Series - COPD Foundation   Pulmonary rehab: Pulmonary Rehabilitation - Department of Medicine ([email or web address]   Smoking cessation: Smoking Cessation - Department of Medicine ([email or web address]

## 2021-04-12 NOTE — Unmapped (Signed)
Faxed provider order to [facility] _001 at [facility] [phone].  Fax confirmation received.

## 2021-04-30 ENCOUNTER — Ambulatory Visit: Admit: 2021-04-30 | Discharge: 2021-04-30 | Disposition: A | Discharge status: Home / Self Care | Payer: MEDICARE

## 2021-04-30 DIAGNOSIS — T7840XA Allergy, unspecified, initial encounter: Principal | ICD-10-CM

## 2021-04-30 DIAGNOSIS — R509 Fever, unspecified: Principal | ICD-10-CM

## 2021-04-30 DIAGNOSIS — N3001 Acute cystitis with hematuria: Principal | ICD-10-CM

## 2021-04-30 LAB — COMPREHENSIVE METABOLIC PANEL
ALBUMIN: 3.6 g/dL (ref 3.4–5.0)
ALKALINE PHOSPHATASE: 89 U/L (ref 46–116)
ALT (SGPT): 59 U/L — ABNORMAL HIGH (ref 10–49)
ANION GAP: 6 mmol/L (ref 5–14)
AST (SGOT): 74 U/L — ABNORMAL HIGH (ref <=34)
BILIRUBIN TOTAL: 0.4 mg/dL (ref 0.3–1.2)
BLOOD UREA NITROGEN: 9 mg/dL (ref 9–23)
BUN / CREAT RATIO: 11
CALCIUM: 9.3 mg/dL (ref 8.7–10.4)
CHLORIDE: 100 mmol/L (ref 98–107)
CO2: 24 mmol/L (ref 20.0–31.0)
CREATININE: 0.84 mg/dL — ABNORMAL HIGH
EGFR CKD-EPI (2021) FEMALE: 75 mL/min/{1.73_m2} (ref >=60)
GLUCOSE RANDOM: 120 mg/dL (ref 70–179)
POTASSIUM: 3.6 mmol/L (ref 3.4–4.8)
PROTEIN TOTAL: 6.9 g/dL (ref 5.7–8.2)
SODIUM: 130 mmol/L — ABNORMAL LOW (ref 135–145)

## 2021-04-30 LAB — CBC W/ AUTO DIFF
BASOPHILS ABSOLUTE COUNT: 0 10*9/L (ref 0.0–0.1)
BASOPHILS RELATIVE PERCENT: 0.1 %
EOSINOPHILS ABSOLUTE COUNT: 0.4 10*9/L (ref 0.0–0.5)
EOSINOPHILS RELATIVE PERCENT: 4.1 %
HEMATOCRIT: 36 % (ref 34.0–44.0)
HEMOGLOBIN: 12.1 g/dL (ref 11.3–14.9)
LYMPHOCYTES ABSOLUTE COUNT: 0.2 10*9/L — ABNORMAL LOW (ref 1.1–3.6)
LYMPHOCYTES RELATIVE PERCENT: 1.7 %
MEAN CORPUSCULAR HEMOGLOBIN CONC: 33.8 g/dL (ref 32.0–36.0)
MEAN CORPUSCULAR HEMOGLOBIN: 28 pg (ref 25.9–32.4)
MEAN CORPUSCULAR VOLUME: 83 fL (ref 77.6–95.7)
MEAN PLATELET VOLUME: 8 fL (ref 6.8–10.7)
MONOCYTES ABSOLUTE COUNT: 0.7 10*9/L (ref 0.3–0.8)
MONOCYTES RELATIVE PERCENT: 6 %
NEUTROPHILS ABSOLUTE COUNT: 9.6 10*9/L — ABNORMAL HIGH (ref 1.8–7.8)
NEUTROPHILS RELATIVE PERCENT: 88.1 %
NUCLEATED RED BLOOD CELLS: 0 /100{WBCs} (ref <=4)
PLATELET COUNT: 193 10*9/L (ref 150–450)
RED BLOOD CELL COUNT: 4.33 10*12/L (ref 3.95–5.13)
RED CELL DISTRIBUTION WIDTH: 13.8 % (ref 12.2–15.2)
WBC ADJUSTED: 10.9 10*9/L (ref 3.6–11.2)

## 2021-04-30 LAB — URINALYSIS WITH MICROSCOPY WITH CULTURE REFLEX
BACTERIA: NONE SEEN /HPF
BILIRUBIN UA: NEGATIVE
BLOOD UA: NEGATIVE
GLUCOSE UA: NEGATIVE
KETONES UA: NEGATIVE
NITRITE UA: NEGATIVE
PH UA: 7 (ref 5.0–9.0)
PROTEIN UA: NEGATIVE
RBC UA: 1 /HPF (ref <=4)
SPECIFIC GRAVITY UA: 1.003 (ref 1.003–1.030)
SQUAMOUS EPITHELIAL: 1 /HPF (ref 0–5)
UROBILINOGEN UA: <2
WBC UA: 3 /HPF (ref 0–5)

## 2021-04-30 MED ORDER — CIPROFLOXACIN 500 MG TABLET
ORAL_TABLET | Freq: Two times a day (BID) | ORAL | 0 refills | 10 days | Status: CP
Start: 2021-04-30 — End: 2021-05-10

## 2021-04-30 NOTE — Unmapped (Addendum)
Pt reports fever (Tmax 103) from Thursday. States she was diagnosed with UTI on the same day and initiated cefdinir. Reports UTI sx improved however fever unchanged. C/o diffuse rashes to all over the body from today. Denies  SOB or chest tightness

## 2021-05-01 MED ORDER — CIPROFLOXACIN 500 MG TABLET
ORAL_TABLET | Freq: Two times a day (BID) | ORAL | 0 refills | 10 days | Status: CP
Start: 2021-05-01 — End: 2021-05-11

## 2021-05-01 NOTE — Unmapped (Signed)
[facility] Emergency Department Provider Note    ED Clinical Impression     Final diagnoses:   Fever, unspecified fever cause (Primary)   Allergic reaction, initial encounter   Acute cystitis with hematuria         Initial Impression, ED Course, Assessment and Plan     Patient is a 71 y.o. female with PMH of chronic rhinosinusitis, MS, GERD, hypothyroidism, migraines, and mitral valve prolapse presenting for 2 days of a persistent fever (Tmax 103) and diffuse myalgias with acute onset of a diffuse, full body rash this morning upon waking up. She was recently started on a course of Cefdinir     On exam, the patient appears nontoxic and in no acute distress. VS are WNL. Diffuse, macular rash without palmar or plantar involvement. Neck supple. No pharyngeal erythema or exudate. No lymphadenopathy. Normal cardiopulmonary exam. Abdomen soft and non-tender. No CVA tenderness.     Differential includes allergic reaction. Less likely rickettsial or viral.      Basic labs ordered in triage with mild hyponatremia to 130. Creatinine slightly elevated to 0.84. AST 74, ALT 59. UA unremarkable. Rapid Influenza/RSV/COVID PCR negative. Plan for discharge at this time. Will switch patient's antibiotic to ciprofloxacin given suspected allergic reaction to Cefdinir. Recommended Benadryl or other over the counter antihistamine as needed for symptoms. Recommended PCP follow-up for further evaluation and symptom management. Provided strict return precautions. The patient expresses understanding and is in agreement with the discharge plan at this time.         ____________________________________________    Time seen: April 30, 2021 5:50 PM       History     Chief Complaint  Fever Between 74 Weeks and 70 Years Old      HPI   Victoria Copeland is a 71 y.o. female with PMH of chronic rhinosinusitis, MS, GERD, hypothyroidism, migraines, and mitral valve prolapse presenting for evaluation of a fever. Patient reports 2 days of a persistent fever (Tmax 103) and diffuse myalgias with acute onset of a diffuse, full body rash this morning upon waking up. No itch associated with the rash. Of note, patient was seen at Urgent Care 04/28/2021 (2 days ago) for several days of dysuria and hematuria where she was diagnosed with a UTI, started at the time on Cefdinir. She states she has been on this antibiotic in the past without a similar reaction. She has been taking Tylenol for her symptoms with most recent dose at midnight last night. Patient denies nausea, vomiting, diarrhea, difficulty breathing, or difficulty swallowing.           Past Medical History:   Diagnosis Date   ??? Allergic rhinitis, unspecified    ??? GERD (gastroesophageal reflux disease)    ??? Hypothyroidism    ??? Migraines    ??? Mitral valve prolapse        Past Surgical History:   Procedure Laterality Date   ??? PR BRONCHOSCOPY,DIAGNOSTIC W LAVAGE Bilateral 01/19/2020    Procedure: BRONCHOSCOPY, RIGID OR FLEXIBLE, INCLUDE FLUOROSCOPIC GUIDANCE WHEN PERFORMED; W/BRONCHIAL ALVEOLAR LAVAGE WITH MODERATE SEDATION;  Surgeon: Baxter Kail, MD;  Location: [facility];  Service: Pulmonary       No current facility-administered medications for this encounter.    Current Outpatient Medications:   ???  albuterol 2.5 mg/0.5 mL nebulizer solution, Inhale 0.5 mL (2.5 mg total) by nebulization every eight (8) hours., Disp: 60 each, Rfl: 11  ???  amitriptyline (ELAVIL) 10 MG tablet, Take 10  mg by mouth nightly. , Disp: , Rfl:   ???  ascorbic acid, vitamin C, (VITAMIN C) 250 MG tablet, Take 250 mg by mouth daily., Disp: , Rfl:   ???  calcium carbonate (TUMS) 200 mg calcium (500 mg) chewable tablet, Chew 1 tablet two (2) times a day as needed. , Disp: , Rfl:   ???  calcium-vitamin D3-vitamin K 650 mg-12.5 mcg-40 mcg Chew, Chew 1 tablet daily., Disp: , Rfl:   ???  cholecalciferol, vitamin D3-50 mcg, 2,000 unit,, 50 mcg (2,000 unit) cap, Take 50 mcg by mouth daily., Disp: , Rfl:   ???  dexAMETHasone 0.5 mg/5 mL elixir, RINSE BY MOUTH FOR ONE MINUTES THEN SPIT. REPEAT FOUR TO FIVE TIMES DAILY, Disp: , Rfl:   ???  fluoride, sodium, 1.1 % Gel, Apply 1 application topically Two (2) times a day. Or as directed, Disp: , Rfl:   ???  levothyroxine (SYNTHROID) 50 MCG tablet, Take 50 mcg by mouth every morning before breakfast. , Disp: , Rfl:   ???  nebulizers Misc, USE AS DIRECTED, Disp: 1 each, Rfl: 0  ???  nirmatrelvir-ritonavir RENAL DOSE (PAXLOVID RENAL DOSE, EUA,) 150-100 mg tablet, See package instructions., Disp: 20 tablet, Rfl: 0  ???  ondansetron (ZOFRAN-ODT) 4 MG disintegrating tablet, Dissolve 1 tablet (4 mg total) by mouth every six (6) hours as needed for nausea., Disp: 20 tablet, Rfl: 0  ???  pediatric multivit-iron-min (FLINTSTONES COMPLETE) tablet, Chew 1 tablet daily., Disp: , Rfl:   ???  promethazine (PHENERGAN) 25 MG suppository, Insert 25 mg into the rectum every six (6) hours as needed., Disp: , Rfl:   ???  propranoloL (INNOPRAN XL) 80 MG 24 hr capsule, Take 80 mg by mouth daily. , Disp: , Rfl:   ???  sodium chloride 3 % nebulizer solution, Inhale 4 mL by nebulization every eight (8) hours., Disp: 240 mL, Rfl: 5    Allergies  Doxycycline, Erythromycin, and Sulfa (sulfonamide antibiotics)    History reviewed. No pertinent family history.    Social History  Social History     Tobacco Use   ??? Smoking status: Never     Passive exposure: Yes   ??? Smokeless tobacco: Never   ??? Tobacco comments:     seond hand exposure   Substance Use Topics   ??? Alcohol use: Not Currently   ??? Drug use: Never         Physical Exam     VITAL SIGNS:    ED Triage Vitals   Enc Vitals Group      BP 04/30/21 1341 117/63      Heart Rate 04/30/21 1341 91      SpO2 Pulse --       Resp 04/30/21 1341 16      Temp 04/30/21 1341 37 ??C (98.6 ??F)      Temp Source 04/30/21 1341 Oral      SpO2 04/30/21 1341 100 %      Weight 04/30/21 1345 56.2 kg (124 lb)     Constitutional: Alert and oriented. Well appearing and in no distress.  Eyes: Conjunctivae are normal.  ENT Head: Normocephalic and atraumatic.       Nose: No congestion.       Mouth/Throat: No pharyngeal erythema or exudate. Mucous membranes are moist.       Neck: No stridor, neck is supple.  Hematological/Lymphatic/Immunilogical: No cervical lymphadenopathy.  Cardiovascular: Normal rate, regular rhythm. No m/g/r. Normal and symmetric distal pulses are present in all extremities.  Respiratory: Normal respiratory effort. Breath sounds are normal. No wheezes, rales, rhonchi.  Gastrointestinal: Soft and nontender. There is no CVA tenderness.  Musculoskeletal: Nontender with normal range of motion in all extremities.       Right lower leg: No tenderness or edema.       Left lower leg: No tenderness or edema.  Neurologic: Normal speech and language. No gross focal neurologic deficits are appreciated.   Skin: Diffuse, macular rash without palmar or plantar involvement.Skin is otherwise warm, dry and intact.   Psychiatric: Mood and affect are normal. Speech and behavior are normal.        Pertinent labs & imaging results that were available during my care of the patient were reviewed by me and considered in my medical decision making (see chart for details).    Documentation assistance was provided by Winferd Humphrey, Scribe, on April 30, 2021 at 5:51 PM for Idalia Needle, MD.    May 02, 2021 3:39 PM. Documentation assistance provided by the scribe. I was present during the time the encounter was recorded. The information recorded by the scribe was done at my direction and has been reviewed and validated by me.        Jamas Lav, MD  05/02/21 1539

## 2021-06-01 ENCOUNTER — Ambulatory Visit: Admit: 2021-06-01 | Discharge: 2021-06-02 | Payer: MEDICARE

## 2021-06-01 MED ORDER — SODIUM CHLORIDE 3 % FOR NEBULIZATION
Freq: Three times a day (TID) | RESPIRATORY_TRACT | 5 refills | 20 days | Status: CP
Start: 2021-06-01 — End: ?
  Filled 2021-06-06: qty 900, 20d supply, fill #0

## 2021-06-03 DIAGNOSIS — A4901 Methicillin susceptible Staphylococcus aureus infection, unspecified site: Principal | ICD-10-CM

## 2021-06-03 MED ORDER — CEPHALEXIN 500 MG CAPSULE
ORAL_CAPSULE | Freq: Two times a day (BID) | ORAL | 0 refills | 14 days | Status: CP
Start: 2021-06-03 — End: 2021-06-17

## 2021-06-04 DIAGNOSIS — J15211 Pneumonia due to Methicillin susceptible Staphylococcus aureus: Principal | ICD-10-CM

## 2021-06-04 MED ORDER — DOXYCYCLINE HYCLATE 100 MG CAPSULE
ORAL_CAPSULE | Freq: Two times a day (BID) | ORAL | 0 refills | 14 days | Status: CP
Start: 2021-06-04 — End: ?

## 2021-06-04 MED ORDER — ONDANSETRON 4 MG DISINTEGRATING TABLET
ORAL_TABLET | Freq: Two times a day (BID) | ORAL | 0 refills | 14 days | Status: CP | PRN
Start: 2021-06-04 — End: 2021-06-18

## 2021-06-04 NOTE — Unmapped (Signed)
Received a call about [name] needing to talk to pulmonary to discuss her antibiotics (prescribed Keflex yesterday by Dr. Marlana Salvage for MSSA lung infection).  She states that she previously had an allergic reaction to cefdinir (hives) but this is not listed on her allergies.  She was broke in hives overnight after taking Keflex.  I advised her to discontinue Keflex and take over-the-counter antihistamines since she only has skin manifestations but no respiratory symptoms (wheezing, shortness of breath, throat closing, etc.).  I also discussed with her alternative antibiotics (specifically doxycycline which is listed under allergies).  I confirmed with the patient that she is not allergic to doxycycline but it only caused nausea in the past that is why she did not want to take it.  Given the limited options with her allergy to multiple medications, she agreed to take doxycycline with as needed Zofran.  I advised her to go to ED for allergic symptoms get worse.  Prescription sent to her pharmacy.  I will update Dr. Marlana Salvage.

## 2021-06-04 NOTE — Unmapped (Signed)
Called Ms. Catterton to let her know that her cultures grew MSSA. She has doxycycline and sulfa allergies. She reports that she is not having fever or other systemic sx but is having increased sputum production with foul odor. Will order 14 day course of Keflex for her. Advised that if she develops worsening sx she should let me know and I will order CXR and we can assess if she needs admission for IV abx. She knows to go to the ED if resp distress. All questions answered.

## 2021-06-10 DIAGNOSIS — J470 Bronchiectasis with acute lower respiratory infection: Principal | ICD-10-CM

## 2021-06-13 ENCOUNTER — Ambulatory Visit: Admit: 2021-06-13 | Discharge: 2021-06-13 | Payer: MEDICARE

## 2021-06-13 DIAGNOSIS — J470 Bronchiectasis with acute lower respiratory infection: Principal | ICD-10-CM

## 2021-06-17 DIAGNOSIS — J470 Bronchiectasis with acute lower respiratory infection: Principal | ICD-10-CM

## 2021-06-20 ENCOUNTER — Other Ambulatory Visit: Payer: Self-pay | Admitting: Nurse Practitioner

## 2021-06-20 ENCOUNTER — Ambulatory Visit: Admit: 2021-06-20 | Discharge: 2021-06-21 | Payer: MEDICARE

## 2021-06-20 DIAGNOSIS — Z1231 Encounter for screening mammogram for malignant neoplasm of breast: Secondary | ICD-10-CM

## 2021-06-20 DIAGNOSIS — J471 Bronchiectasis with (acute) exacerbation: Principal | ICD-10-CM

## 2021-06-21 NOTE — Unmapped (Signed)
Called and spoke with Victoria Copeland. I let her know that her CT scan was overall stable. She does not have any cavitary disease to indicate active M abscessus infection. We discussed that unfortunately, due to not enough sample her sputum was not run for AFB. This had been the intention of the repeat sputum. At this time, given improvement with treatment of MSSA with doxycycline and stable imaging. Will hold off on bronchoscopy. Will plan for PFTs at her follow up visit and if declined, will more strongly discuss bronch.     Standing order for AFB placed. FVL order placed for follow up visit.

## 2021-06-22 ENCOUNTER — Ambulatory Visit
Admission: RE | Admit: 2021-06-22 | Discharge: 2021-06-22 | Disposition: A | Discharge status: Home / Self Care | Payer: Medicare Other | Source: Ambulatory Visit | Attending: Nurse Practitioner | Admitting: Nurse Practitioner

## 2021-06-22 DIAGNOSIS — Z1231 Encounter for screening mammogram for malignant neoplasm of breast: Secondary | ICD-10-CM | POA: Diagnosis not present

## 2021-09-07 ENCOUNTER — Other Ambulatory Visit: Admit: 2021-09-07 | Discharge: 2021-09-08 | Payer: MEDICARE

## 2021-09-07 ENCOUNTER — Institutional Professional Consult (permissible substitution): Admit: 2021-09-07 | Discharge: 2021-09-08 | Payer: MEDICARE

## 2021-09-08 NOTE — Unmapped (Signed)
Patient arrived to clinic with a sputum sample, sent to lab for processing (AFB).

## 2021-09-19 DIAGNOSIS — J479 Bronchiectasis, uncomplicated: Principal | ICD-10-CM

## 2021-09-19 MED ORDER — ALBUTEROL SULFATE CONCENTRATE 2.5 MG/0.5 ML SOLUTION FOR NEBULIZATION
Freq: Three times a day (TID) | RESPIRATORY_TRACT | 11 refills | 20 days | Status: CP | PRN
Start: 2021-09-19 — End: 2021-10-19
  Filled 2021-09-27: qty 60, 20d supply, fill #0

## 2021-09-19 MED FILL — SODIUM CHLORIDE 3 % FOR NEBULIZATION: RESPIRATORY_TRACT | 20 days supply | Qty: 240 | Fill #1

## 2021-09-25 NOTE — Unmapped (Signed)
Pulmonary Clinic - Return Visit    Referring Physician :  Caryl Asp, Victoria Copeland  PCP:     Victoria Asp, Victoria Copeland  Reason for Consult:   Abnormal CT scan with pulmonary nodules, cough    ASSESSMENT and PLAN     Victoria Copeland is a 71 y.o. female with history of chronic rhinosinusitis, MS and GERD whom we are seeing for routine follow up of bronchiectasis.     # Bronchiectasis with bilateral Infiltrates, tree-in-bud nodules   - CT 01/2019  R>L bronchiectasis, more prominent in bilateral upper lobes and lingula/RML. Additionally, had bilateral solid and mixed nodules with some tree-in-bud. CT completed 08/2019 demonstrated overall  improvement in opacities, as well as improvement in FEV1 by spirometry.   - As far as etiology of bronchiectasis, unclear; no hypogammaglobulinemia, normal IgE, HIV negative, CF testing normal, alpha one testing normal  - Based on 01/05/2020 PFTs and fevers bronchoscopy was performed which showed PSA, penicillium, and actinomyces   - Decision to treat with levaquin for 14 days and inhaled tobramycin for 28 days however patient continued to have worsening sx and PFTs    - Patient admitted, IV ceftaz for 10 days, completed 30 day course of tobramycin  - Completed trial of prednisone course due to peripheral eos, felt improved but did have poor sleep and increased appetite. No longer on.   - CT 04/07/20 overall improved/stable and PFTs 02/26/20 improved after hospitalization  - On 09/2020 has been treated once with Levaquin since hospitalization due to infectious sx   - At this time given how well patient has recovered, will hold off on bronch to confirm eradication of PSA as patient does not produce sputum   - Patient received a percussive vest from [facility] somehow, I did not request this initially, and she does not need to continue use as she has other forms of airway clearance. Will reach out to [facility].    -Will add 3% HTS with aerobika at first sx of congestion or exacerbation, 7% caused oropharyngeal irritation, pre-med with albuterol   - FVL 2/13 best they have been in the last year, will continue current regimen    - Of note, she will be going to [place] in May, if has sx will reach out for abx prior to trip   - Annual sputum for screening and PRN with sx of exacerbation   - Patient does not feel as though she is currently exacerbating but she notes a raw and sore feeling in her lungs/chest. Advised that if her sputum production is back to normal lets try cutting back to once daily on the HTS and add a LABA/ICS for 14 days to see if it helps with the airway inflammation  - Has standing Q59month AFB and lower resp culture.     # Health Maintenance  Covid-19: S/p J&J 4/6/2, Wants to continue to wait on booster  Influenza: scheduled for 04/2019 with pcp  Pneumovax: Would recommend  unless has received in < 5 yrs. Prevnar vaccine which was given 11/2019 (due 05/2020)   Prenvar: s/p 12/02/2018, 12/02/2019  Tdap: UTD 12/02/2013  Lung cancer screening: Not a candidate  ** Due to PCV but she wants to check on immunizations with [facility] where [facility] gets them     Immunization History   Administered Date(s) Administered    COVID-19 VACC,(JANSSEN)(PF) 05/27/2019    INFLUENZA INJ MDCK PF, QUAD,(FLUCELVAX)(82MO AND UP EGG FREE) 12/27/2015, 01/03/2018, 01/27/2019    INFLUENZA TIV (TRI) 82MO+ W/ PRESERV (  IM) 12/02/2013, 12/08/2014    Influenza Virus Vaccine, unspecified formulation 12/03/2012    Influenza,Injectable, MDCK, QUAD w/preservative 01/02/2017    Pneumococcal Conjugate 13-Valent 12/02/2018, 12/02/2019    TdaP 12/02/2013       This patient was seen and discussed with attending physician Dr. Curley Spice,  who agrees with the assessment and plan above.    CC: Victoria Asp, Victoria Copeland    HISTORY:     Interval History   Patient is feeling much improved since her hospitalization. She has minimal shortness of breath. Much improved cough, still with VERY little sputum production despite doing airway clearance 3x a day. 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 She does report some tonsillar irritation ever since being on the tobra, describes it as s tickle in her throat.     History of Present Illness (02/12/2019):  Victoria Copeland is a 71 y.o. female with history of chronic rhinosinusitis, MS and GERD whom we are seeing in consultation for evaluation of abnormal CT scan and cough. Victoria Copeland reports she has had a cough for about the past year. Cough seems to wax and wane, although has become more persistent and irritating. Cough does seem to be worse with seasonal changes / allergy flares. Not worsened with PO intake and no concern for aspiration. Has minimal GERD symptoms. Cough is often dry, but notes intermittently and more recently, she is able to cough up sputum with is thick yellow/green, sticky and foul-smelling. She notes she has chronic rhinosinusitis with obvious post-nasal drip; her therapy has recently been stepped up to steroid rinses for this. She does query if some of what she coughs up is this drainage. She reports she'd have mild-to-moderate SOB with heavier exertion, although no SOB with routine activities. She denies fevers, chills, sweats.    She notes given persistence and worsening of cough, a CXR was obtained which was abnormal, and led to a CT scan completed earlier this month which demonstrated multiple pulmonary nodules (see objective section below for interpretation). She was referred to [place] for further evaluation.     She does not smoke, but notes she has had significant second hand smoke exposure taking care of her mother over the years who was a heavy smoker. No inhaled substances. No known occupational exposures. Denies significant childhood illnesses, although notes for past several years, as above, has had recurrent sinus infections for which she follows with ENT. Notes she infrequently has chest colds, but has never had a severe respiratory illness. No known bronchiectasis in family. She has had multiple children. Family history notable for asthma and acute eosinophilic pneumonia of unclear etiology in her sister. Her mother had COPD associated with significant smoking. No family history of lung cancer, although this is a big concern for Victoria Copeland given her second hand smoke exposure. No known history of rheumatologic diseases. She has MS, which she states has been slowly progressive, and she does not require any directed therapy for this (has been on no immunosuppression).    Interval Update 10/18/20  Since her last visit in clinic Victoria Copeland completed one course of levaquin for cough and congestion. She was also treated for COVID with paxlovid with only mild sx. She has otherwise been doing well. She received a percussive vest from [employer] and has been using this in place of her nebulizer which she much prefers. Still not producing much mucus. No fevers. Able to sing in chorus.  Interval Update 04/04/21  Victoria Copeland has been feeling great. She does huff cough daily, she also has a nebulizer and will do albuterol and 3% saline via [place] if she has a cold or notices more sputum. Denies any other complaints. No sputum production and stable cough. Able to sing in choir.     Interval Update 09/26/2021   She has been using her albuterol and HTS saline twice a day with her [place]. When she does cough up sputum it is no longer foul smelling. She brought a sample 7/17 after she was around a sick grandchild but it seems to have been lost. Her sputum has returned to normal but she is having a very raw sore feeling in her chest.     PMH: Chronic rhinosinusitis / allergic rhinitis  Hypothyroidism  Migraines  Multiple sclerosis -- slowly progressive, diagnosed 30 yrs ago, on no medications, no recent exacerbations  Mitral valve Prolapse  GERD, occasional     SurgHx: Appendectomy  Hysterectomy  Tubal ligation     FH: Father (deceased): Alzheimer's Disease, EtOH abuse, stroke  Mother: COPD (assocated with smoking), Migraines, glaucoma, skin cancer  Sister: Asthma, eosinophilic pneumonia, pan-sinusitis  Maternal aunt: Ovarian cancer, throat cancer, pancreatic cancer  MGM: Ovarian Cancer, DM     SocHx: Tobacco: Never, although significant second hand exposure  EtOH: Denies  Drugs: Denies  Living situation: Lives in [place], hosue built 2017, home treated for mold growth underneath house in crawl space a couple of years ago  Occupational: More recently office clerical work, 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 Pertinent pulmonary meds:  Budesonide - nasal/sinus rinses     Allergies: Allergies   Allergen Reactions    Keflex [Cephalexin] Hives    Doxycycline Nausea Only    Erythromycin Nausea Only    Sulfa (Sulfonamide Antibiotics) Rash      ROS: 12-point ROS reviewed with patient with pertinent positives and negatives as per HPI.       PHYSICAL EXAM:     Vitals:    09/26/21 1122   BP: 134/75   Pulse: 51   Resp: 16   Temp: 36.2 ??C (97.1 ??F)   SpO2: 97%       General: No acute distress   Eyes: EOMI, sclera anicteric  CV: RRR, no m/r/g  Lungs: Normal work of breathing without accessory muscle usage, CTAB with good air movement bilaterally without wheezes or crackles  Abd: ND  Ext: no clubbing, no cyanosis  Skin: No rashes  Neuro: No focal deficits, alert & oriented    LABORATORY and RADIOLOGY DATA:     PFTs        Pertinent Laboratory Data:    06/13/21 OPF   06/01/21 MSSA  08/2021 brought in a sample but it was not run for an unclear reason     Pertinent Imaging Data:  CT 04/2019:  IMPRESSION:   Interval improvement of patchy and nodular pulmonary parenchymal opacities with residual nodular opacities. Lingular bronchiectasis. Findings are most likely representing endobronchial infection such as MAI. Recommend 3-6 month follow-up chest CT for further evaluation.    CT 08/2019  IMPRESSION:  Persistent bronchiectasis, small airway inflammation and associated peripheral respiratory bronchiolitis, with mild increase in the bronchiolitis in the peripheral right lower lobe. 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    CT 12/2019  IMPRESSION:   Mild lingular bronchiectasis, and clustered nodular  pulmonary parenchymal opacities most likely representing indolent small airway infection. Overall the appearance is grossly unchanged from prior studies.    CT 2/22  IMPRESSION:  Unchanged bronchiectasis within the lingula and medial right middle lobe as well as multiple clusters of centrilobular pulmonary nodules, which can be seen in the setting of indolent small airway infection.

## 2021-09-26 ENCOUNTER — Ambulatory Visit
Admit: 2021-09-26 | Discharge: 2021-09-26 | Payer: MEDICARE | Attending: Student in an Organized Health Care Education/Training Program | Primary: Student in an Organized Health Care Education/Training Program

## 2021-09-26 ENCOUNTER — Ambulatory Visit: Admit: 2021-09-26 | Discharge: 2021-09-26 | Payer: MEDICARE

## 2021-09-26 DIAGNOSIS — J471 Bronchiectasis with (acute) exacerbation: Principal | ICD-10-CM

## 2021-09-26 MED ORDER — FLUTICASONE FUROATE 100 MCG-VILANTEROL 25 MCG/DOSE INHALATION POWDER
Freq: Every day | RESPIRATORY_TRACT | 0 refills | 14 days | Status: CP
Start: 2021-09-26 — End: 2021-10-10

## 2021-09-26 NOTE — Unmapped (Signed)
Thank you for allowing me to be a part of your care. Please call the clinic with any questions.    Dahlia Byes, MD     Between appointments, you can reach [place] at these numbers:    For appointments or the Pulmonary Nurse: [phone], Fax: [phone]  For emergency issues after hours: Hospital Operator: [phone], ask for Pulmonary Fellow on call  [facility]: [phone]  Pharmacy Assistance Program: [phone]    Important Links:  How to use inhaler videos: COPD Inhaler Educational Video Series - [employer]   Pulmonary rehab: Pulmonary Rehabilitation - Department of Medicine ([email or web address]   Smoking cessation: Smoking Cessation - Department of Medicine ([email or web address]

## 2022-01-02 ENCOUNTER — Ambulatory Visit
Admit: 2022-01-02 | Discharge: 2022-01-03 | Payer: MEDICARE | Attending: Student in an Organized Health Care Education/Training Program | Primary: Student in an Organized Health Care Education/Training Program

## 2022-01-02 DIAGNOSIS — J470 Bronchiectasis with acute lower respiratory infection: Principal | ICD-10-CM

## 2022-01-02 MED ORDER — DOXYCYCLINE MONOHYDRATE 100 MG TABLET
ORAL_TABLET | Freq: Two times a day (BID) | ORAL | 0 refills | 14 days | Status: CP
Start: 2022-01-02 — End: 2022-01-16

## 2022-01-02 NOTE — Unmapped (Signed)
Thank you for allowing me to be a part of your care. Please call the clinic with any questions.    I have ordered 14 days of doxycycline to [facility].     Dahlia Byes, MD     Between appointments, you can reach [place] at these numbers:    For appointments or the Pulmonary Nurse: [phone], Fax: [phone]  For emergency issues after hours: Hospital Operator: [phone], ask for Pulmonary Fellow on call  [facility]: [phone]  Pharmacy Assistance Program: [phone]    Important Links:  How to use inhaler videos: COPD Inhaler Educational Video Series - [employer]   Pulmonary rehab: Pulmonary Rehabilitation - Department of Medicine ([email or web address]   Smoking cessation: Smoking Cessation - Department of Medicine ([email or web address]

## 2022-01-02 NOTE — Unmapped (Signed)
Pulmonary Clinic - Return Visit    Referring Physician :  Myrene Buddy, FNP  PCP:     Myrene Buddy, FNP  Reason for Consult:   Abnormal CT scan with pulmonary nodules, cough    ASSESSMENT and PLAN     Victoria Copeland is a 71 y.o. female with history of chronic rhinosinusitis, MS and GERD whom we are seeing for routine follow up of bronchiectasis.     # Bronchiectasis with bilateral Infiltrates, tree-in-bud nodules   - CT 01/2019  R>L bronchiectasis, more prominent in bilateral upper lobes and lingula/RML. Additionally, had bilateral solid and mixed nodules with some tree-in-bud. CT completed 08/2019 demonstrated overall  improvement in opacities, as well as improvement in FEV1 by spirometry.   - As far as etiology of bronchiectasis, unclear; no hypogammaglobulinemia, normal IgE, HIV negative, CF testing normal, alpha one testing normal  - Based on 01/05/2020 PFTs and fevers bronchoscopy was performed which showed PSA, penicillium, and actinomyces  - Decision to treat with levaquin for 14 days and inhaled tobramycin for 28 days however patient continued to have worsening sx and PFTs    - Patient admitted, IV ceftaz for 10 days, completed 30 day course of tobramycin  - Completed trial of prednisone course due to peripheral eos, felt improved but did have poor sleep and increased appetite. No longer on.   - CT 04/07/20 overall improved/stable and PFTs 02/26/20 improved after hospitalization  - On 09/2020 has been treated once with Levaquin since hospitalization due to infectious sx   - At this time given how well patient has recovered, will hold off on bronch to confirm eradication of PSA as patient does not produce sputum   - Patient received a percussive vest from [facility] somehow, I did not request this initially, and she does not need to continue use as she has other forms of airway clearance. Will reach out to [facility] to d/c.   -Albuterol with 3% HTS with aerobika at first sx of congestion or exacerbation, 7% caused oropharyngeal irritation, pre-med with albuterol   - FVL 2/13 best they have been in the last year, will continue current regimen   - Has standing Q82month AFB and lower resp culture but typically cannot produce sputum   - Does not feel she can do an induced sputum today   -06/01/21 Patient sputum culture with 4+ MRSA and AFB with M absesscus for which CT was obtained. No cavitary lesion at that time and has not been able to produce sputum since so have not treated for M abscessus. Treated with doxycycline with improvement.   -01/02/22 will treat with 14 days of doxycycline     # Health Maintenance  Covid-19: S/p J&J 4/6/2, Defers any future COVID vaccines   Influenza: will get it once over this acute illness.   Prevnar 20: Will give at next appt   Prenvar: s/p 12/02/2018, 12/02/2019  Tdap: UTD 12/02/2013  Lung cancer screening: Not a candidate    Immunization History   Administered Date(s) Administered    COVID-19 VACC,(JANSSEN)(PF) 05/27/2019    INFLUENZA INJ MDCK PF, QUAD,(FLUCELVAX)(24MO AND UP EGG FREE) 12/27/2015, 01/03/2018, 01/27/2019    INFLUENZA TIV (TRI) 24MO+ W/ PRESERV (IM) 12/02/2013, 12/08/2014    Influenza Virus Vaccine, unspecified formulation 12/03/2012    Influenza,Injectable, MDCK, QUAD w/preservative 01/02/2017    Pneumococcal Conjugate 13-Valent 12/02/2018, 12/02/2019    TdaP 12/02/2013       This patient was seen and discussed with attending physician Dr. Allena Katz  who agrees with the assessment and plan above.    CC: Gauger, Hermenia Fiscal, FNP    HISTORY:     Interval History   Patient is feeling much improved since her hospitalization. She has minimal shortness of breath. Much improved cough, still with VERY little sputum production despite doing airway clearance 3x a day. 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 She does report some tonsillar irritation ever since being on the tobra, describes it as s tickle in her throat.     History of Present Illness (02/12/2019):  Victoria Copeland is a 71 y.o. female with history of chronic rhinosinusitis, MS and GERD whom we are seeing in consultation for evaluation of abnormal CT scan and cough. Ms. Anelli reports she has had a cough for about the past year. Cough seems to wax and wane, although has become more persistent and irritating. Cough does seem to be worse with seasonal changes / allergy flares. Not worsened with PO intake and no concern for aspiration. Has minimal GERD symptoms. Cough is often dry, but notes intermittently and more recently, she is able to cough up sputum with is thick yellow/green, sticky and foul-smelling. She notes she has chronic rhinosinusitis with obvious post-nasal drip; her therapy has recently been stepped up to steroid rinses for this. She does query if some of what she coughs up is this drainage. She reports she'd have mild-to-moderate SOB with heavier exertion, although no SOB with routine activities. She denies fevers, chills, sweats.    She notes given persistence and worsening of cough, a CXR was obtained which was abnormal, and led to a CT scan completed earlier this month which demonstrated multiple pulmonary nodules (see objective section below for interpretation). She was referred to [place] for further evaluation.     She does not smoke, but notes she has had significant second hand smoke exposure taking care of her mother over the years who was a heavy smoker. No inhaled substances. No known occupational exposures. Denies significant childhood illnesses, although notes for past several years, as above, has had recurrent sinus infections for which she follows with ENT. Notes she infrequently has chest colds, but has never had a severe respiratory illness. No known bronchiectasis in family. She has had multiple children. Family history notable for asthma and acute eosinophilic pneumonia of unclear etiology in her sister. Her mother had COPD associated with significant smoking. No family history of lung cancer, although this is a big concern for Ms. Kittel given her second hand smoke exposure. No known history of rheumatologic diseases. She has MS, which she states has been slowly progressive, and she does not require any directed therapy for this (has been on no immunosuppression).    Interval Update 10/18/20  Since her last visit in clinic Ms. Tiner completed one course of levaquin for cough and congestion. She was also treated for COVID with paxlovid with only mild sx. She has otherwise been doing well. She received a percussive vest from [employer] and has been using this in place of her nebulizer which she much prefers. Still not producing much mucus. No fevers. Able to sing in chorus.     Interval Update 04/04/21  Ms. Qualley has been feeling great. She does huff cough daily, she also has a nebulizer and will do albuterol and 3% saline via [place] if she has a cold or notices more sputum. Denies any other complaints. No sputum production and stable cough. Able to  sing in choir.     Interval Update 09/26/2021   She has been using her albuterol and HTS saline twice a day with her [place]. When she does cough up sputum it is no longer foul smelling. She brought a sample 7/17 after she was around a sick grandchild but it seems to have been lost. Her sputum has returned to normal but she is having a very raw sore feeling in her chest.     01/02/22  Started having cough with scant light green sputum production one week ago. Has also been having nightly fevers. Last night was her first night without a fever and she feels slightly better than early last week. She was recently around family who developed resp sx several days ago. COVID NEG x2 at home. She does not feel that she can produce sputum today.    PMH: Chronic rhinosinusitis / allergic rhinitis  Hypothyroidism  Migraines  Multiple sclerosis -- slowly progressive, diagnosed 30 yrs ago, on no medications, no recent exacerbations  Mitral valve Prolapse  GERD, occasional     SurgHx: Appendectomy  Hysterectomy  Tubal ligation     FH: Father (deceased): Alzheimer's Disease, EtOH abuse, stroke  Mother: COPD (assocated with smoking), Migraines, glaucoma, skin cancer  Sister: Asthma, eosinophilic pneumonia, pan-sinusitis  Maternal aunt: Ovarian cancer, throat cancer, pancreatic cancer  MGM: Ovarian Cancer, DM     SocHx: Tobacco: Never, although significant second hand exposure  EtOH: Denies  Drugs: Denies  Living situation: Lives in [place], hosue built 2017, home treated for mold growth underneath house in crawl space a couple of years ago  Occupational: More recently office clerical work, 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 Pertinent pulmonary meds:  Budesonide - nasal/sinus rinses     Allergies: Allergies   Allergen Reactions    Keflex [Cephalexin] Hives    Doxycycline Nausea Only    Erythromycin Nausea Only    Sulfa (Sulfonamide Antibiotics) Rash      ROS: 12-point ROS reviewed with patient with pertinent positives and negatives as per HPI.       PHYSICAL EXAM:     Vitals:    01/02/22 1419   BP: 120/69   Pulse: 57   Temp: 36.2 ??C (97.2 ??F)   SpO2: 97%       General: No acute distress   Eyes: EOMI, sclera anicteric  CV: RRR, no m/r/g  Lungs: Crackles at bilateral bases   Abd: ND  Ext: no clubbing, no cyanosis  Skin: No rashes  Neuro: No focal deficits, alert & oriented    LABORATORY and RADIOLOGY DATA:     PFTs        Pertinent Laboratory Data:    06/13/21 OPF   06/01/21 MSSA  08/2021 brought in a sample but it was not run for an unclear reason     Pertinent Imaging Data:  CT 04/2019:  IMPRESSION:   Interval improvement of patchy and nodular pulmonary parenchymal opacities with residual nodular opacities. Lingular bronchiectasis. Findings are most likely representing endobronchial infection such as MAI. Recommend 3-6 month follow-up chest CT for further evaluation.    CT 08/2019  IMPRESSION:  Persistent bronchiectasis, small airway inflammation and associated peripheral respiratory bronchiolitis, with mild increase in the bronchiolitis in the peripheral right lower lobe. 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    CT 12/2019  IMPRESSION:   Mild lingular bronchiectasis, and clustered  nodular pulmonary parenchymal opacities most likely representing indolent small airway infection. Overall the appearance is grossly unchanged from prior studies.    CT 2/22  IMPRESSION:  Unchanged bronchiectasis within the lingula and medial right middle lobe as well as multiple clusters of centrilobular pulmonary nodules, which can be seen in the setting of indolent small airway infection.

## 2022-01-03 NOTE — Unmapped (Signed)
Addended by: Charmaine Downs on: 01/02/2022 04:43 PM     Modules accepted: Level of Service

## 2022-02-07 ENCOUNTER — Ambulatory Visit
Admission: EM | Admit: 2022-02-07 | Discharge: 2022-02-07 | Disposition: A | Discharge status: Home / Self Care | Payer: Medicare Other | Attending: Family Medicine | Admitting: Family Medicine

## 2022-02-07 DIAGNOSIS — N3001 Acute cystitis with hematuria: Secondary | ICD-10-CM | POA: Insufficient documentation

## 2022-02-07 LAB — URINALYSIS, ROUTINE W REFLEX MICROSCOPIC
Bilirubin Urine: NEGATIVE
Glucose, UA: NEGATIVE mg/dL
Ketones, ur: NEGATIVE mg/dL
Nitrite: NEGATIVE
Protein, ur: NEGATIVE mg/dL
Specific Gravity, Urine: 1.01 (ref 1.005–1.030)
pH: 7 (ref 5.0–8.0)

## 2022-02-07 LAB — URINALYSIS, MICROSCOPIC (REFLEX)

## 2022-02-07 MED ORDER — CIPROFLOXACIN HCL 500 MG PO TABS: 500.0000 mg | ORAL_TABLET | Freq: Two times a day (BID) | ORAL | 0 refills | Status: DC

## 2022-02-07 NOTE — Discharge Instructions (Signed)
You had evidence of of a bacterial urine infection today.  Stop by the pharmacy to pick up your prescriptions.  For your UTI: Take Cipro twice a day for the next 5 days  If your symptoms do not improve in the next 7 days, be sure to follow-up here or at your primary care provider office.  Go to the emergency department if you are having increasing pain, worsening vaginal bleeding or fever.

## 2022-02-07 NOTE — ED Provider Notes (Signed)
[facility]    CSN: [id number] Arrival date & time: 02/07/22  1005      History   Chief Complaint Chief Complaint  Patient presents with   Hematuria     HPI HPI Shannon Gilmore is a 71 y.o. female.    [name] presents for a couple days of dysuria and new onset hematuria today.  She has history of urinary tract infections.  She says this is how they start..  She is not having vaginal itching or vaginal discharge.  There has been no rash, fever, nausea, belly pain or back pain.     Past Medical History:  Diagnosis Date   Cancer ([place])    skin ca   Dental bridge present permanent - top right   Hypothyroidism    Migraine headache    approx 1x/mo for 3 days   MS (multiple sclerosis) (HCC)    MVP (mitral valve prolapse)    mild, dx'd "many yrs ago". no issues   Palpitations     There are no problems to display for this patient.   Past Surgical History:  Procedure Laterality Date   ABDOMINAL HYSTERECTOMY     ABDOMINAL SURGERY     Barbie Banner OSTEOTOMY Left 11/15/2017   Procedure: Barbie Banner OSTEOTOMY;  Surgeon: Albertine Patricia, DPM;  Location: [facility];  Service: Podiatry;  Laterality: Left;  mini monster paragon screws and staples lma with local   [place] Left 11/15/2017   Procedure: HALLUX VALGUS AUSTIN;  Surgeon: Albertine Patricia, DPM;  Location: [facility];  Service: Podiatry;  Laterality: Left;    OB History   No obstetric history on file.      Home Medications    Prior to Admission medications   Medication Sig Start Date End Date Taking? Authorizing Provider  amitriptyline (ELAVIL) 10 MG tablet Take 10 mg by mouth at bedtime.   Yes [provider]  aspirin 81 MG tablet Take 81 mg by mouth daily.   Yes [provider]  Calcium-Vitamin D-Vitamin K (VIACTIV PO) Take by mouth daily.   Yes [provider]  Cholecalciferol (VITAMIN D PO) Take by mouth daily.   Yes [provider]  ciprofloxacin (CIPRO) 500 MG tablet Take 1 tablet (500 mg total) by mouth every 12 (twelve) hours. 02/07/22  Yes Steffany Schoenfelder, Ronnette Juniper, DO  conjugated estrogens (PREMARIN) vaginal cream Place 1 Applicatorful vaginally daily.   Yes [provider]  dexamethasone 0.5 MG/5ML elixir Take 5 mLs by mouth daily.   Yes [provider]  levothyroxine (SYNTHROID, LEVOTHROID) 50 MCG tablet Take 50 mcg by mouth daily before breakfast.   Yes [provider]  Multiple Vitamins-Minerals (CENTRUM SILVER PO) Take 0.5 tablets by mouth daily.   Yes [provider]  oxyCODONE-acetaminophen (PERCOCET) 7.5-325 MG tablet Take 1 tablet by mouth every 4 (four) hours as needed for severe pain. 11/15/17  Yes Troxler, Rodman Key, DPM  propranolol ER (INDERAL LA) 80 MG 24 hr capsule Take 80 mg by mouth daily.   Yes [provider]    Family History Family History  Problem Relation Age of Onset   Breast cancer Neg Hx     Social History Social History   Tobacco Use   Smoking status: Never   Smokeless tobacco: Never  Vaping Use   Vaping Use: Never used  Substance Use Topics   Alcohol use: No   Drug use: Never     Allergies   Cephalexin, Cefdinir, Cephalothin, Erythromycin,  and Sulfa antibiotics   Review of Systems Review of Systems: :negative unless otherwise stated in HPI.      Physical Exam Triage Vital Signs ED Triage Vitals  Enc Vitals Group     BP 02/07/22 1103 138/86     Pulse Rate 02/07/22 1103 (!) 54     Resp 02/07/22 1103 18     Temp 02/07/22 1103 97.8 F (36.6 C)     Temp Source 02/07/22 1103 Oral     SpO2 02/07/22 1103 94 %     Weight 02/07/22 1101 128 lb (58.1 kg)     Height 02/07/22 1101 '5\' 1"'$  (1.549 m)     Head Circumference --      Peak Flow --      Pain Score 02/07/22 1100 3     Pain Loc --      Pain Edu? --      Excl. in [place]? --    No data found.  Updated Vital Signs BP 138/86 (BP Location: Left Arm)   Pulse (!) 54    Temp 97.8 F (36.6 C) (Oral)   Resp 18   Ht '5\' 1"'$  (1.549 m)   Wt 58.1 kg   SpO2 94%   BMI 24.19 kg/m   Visual Acuity Right Eye Distance:   Left Eye Distance:   Bilateral Distance:    Right Eye Near:   Left Eye Near:    Bilateral Near:     Physical Exam GEN: well appearing female in no acute distress  CVS: well perfused  RESP: speaking in full sentences without pause  ABD: soft, non-tender, non-distended, no palpable masses, no CVA tenderness      UC Treatments / Results  Labs (all labs ordered are listed, but only abnormal results are displayed) Labs Reviewed  URINALYSIS, ROUTINE W REFLEX MICROSCOPIC - Abnormal; Notable for the following components:      Result Value   Hgb urine dipstick MODERATE (*)    Leukocytes,Ua MODERATE (*)    All other components within normal limits  URINALYSIS, MICROSCOPIC (REFLEX) - Abnormal; Notable for the following components:   Bacteria, UA MANY (*)    All other components within normal limits  URINE CULTURE    EKG   Radiology No results found.  Procedures Procedures (including critical care time)  Medications Ordered in UC Medications - No data to display  Initial Impression / Assessment and Plan / UC Course  I have reviewed the triage vital signs and the nursing notes.  Pertinent labs & imaging results that were available during my care of the patient were reviewed by me and considered in my medical decision making (see chart for details).     Acute cystitis:  Patient is a 71 y.o. female  who presents for 3 days of dysuria and 1 day of hematuria. Overall patient is well-appearing and afebrile.  Vital signs stable.  UA consistent with acute cystitis.  Hematuria supported on microscopy.  She has allergies to different antibiotics.  Treat with Cipro as she has had relief from this in the past.  Urine culture sent.  Follow-up sensitivities and change antibiotics, if needed. Return precautions including abdominal pain, fever,  chills, nausea, or vomiting given.   Discussed MDM, treatment plan and plan for follow-up with patient/parent who agrees with plan.        Final Clinical Impressions(s) / UC Diagnoses   Final diagnoses:  Acute cystitis with hematuria     Discharge Instructions  You had evidence of of a bacterial urine infection today.  Stop by the pharmacy to pick up your prescriptions.  For your UTI: Take Cipro twice a day for the next 5 days  If your symptoms do not improve in the next 7 days, be sure to follow-up here or at your primary care provider office.  Go to the emergency department if you are having increasing pain, worsening vaginal bleeding or fever.     ED Prescriptions     Medication Sig Dispense Auth. Provider   ciprofloxacin (CIPRO) 500 MG tablet Take 1 tablet (500 mg total) by mouth every 12 (twelve) hours. 10 tablet Lyndee Hensen, DO      PDMP not reviewed this encounter.   Lyndee Hensen, DO 02/07/22 1454

## 2022-02-07 NOTE — ED Triage Notes (Signed)
Pt c/o blood in urine x1day urinary urgency and burning x2-3days  Pt states that she can take Ciprofloxacin.

## 2022-02-09 LAB — URINE CULTURE: Culture: <10000 — AB

## 2022-02-23 ENCOUNTER — Other Ambulatory Visit: Payer: Self-pay | Admitting: Nurse Practitioner

## 2022-02-23 DIAGNOSIS — R1011 Right upper quadrant pain: Secondary | ICD-10-CM

## 2022-02-23 DIAGNOSIS — K8020 Calculus of gallbladder without cholecystitis without obstruction: Secondary | ICD-10-CM

## 2022-03-01 NOTE — Unmapped (Signed)
Fax prescriber's order to [facility] [phone].  Fax confirmation received.

## 2022-03-07 ENCOUNTER — Ambulatory Visit
Admission: RE | Admit: 2022-03-07 | Discharge: 2022-03-07 | Disposition: A | Discharge status: Home / Self Care | Payer: Medicare Other | Source: Ambulatory Visit | Attending: Nurse Practitioner | Admitting: Nurse Practitioner

## 2022-03-07 DIAGNOSIS — R1011 Right upper quadrant pain: Secondary | ICD-10-CM | POA: Insufficient documentation

## 2022-03-07 DIAGNOSIS — K8020 Calculus of gallbladder without cholecystitis without obstruction: Secondary | ICD-10-CM | POA: Insufficient documentation

## 2022-04-06 NOTE — Unmapped (Signed)
Pulmonary Clinic - Return Visit    Referring Physician :  Myrene Buddy, FNP  PCP:     Myrene Buddy, FNP  Reason for Consult:   Abnormal CT scan with pulmonary nodules, cough    ASSESSMENT and PLAN     Ms. Dever is a 72 y.o. female with history of chronic rhinosinusitis, MS and GERD whom we are seeing for routine follow up of bronchiectasis.     # Bronchiectasis with bilateral Infiltrates, tree-in-bud nodules   - CT 01/2019  R>L bronchiectasis, more prominent in bilateral upper lobes and lingula/RML. Additionally, had bilateral solid and mixed nodules with some tree-in-bud. CT completed 08/2019 demonstrated overall  improvement in opacities, as well as improvement in FEV1 by spirometry.   - As far as etiology of bronchiectasis, unclear; no hypogammaglobulinemia, normal IgE, HIV negative, CF testing normal, alpha one testing normal  - Based on 01/05/2020 PFTs and fevers bronchoscopy was performed which showed PSA, penicillium, and actinomyces  - Decision to treat with levaquin for 14 days and inhaled tobramycin for 28 days however patient continued to have worsening sx and PFTs     - Patient admitted, IV ceftaz for 10 days, completed 30 day course of tobramycin    - CT 04/07/20 overall improved/stable and PFTs 02/26/20 improved after     hospitalization  - Completed trial of prednisone course due to peripheral eos, felt improved but did have poor sleep and increased appetite. Discontinued.   - At this time given how well patient has recovered, will hold off on bronch to confirm eradication of PSA as patient does not produce sputum    -Albuterol with 3% HTS with aerobika at first sx of congestion or exacerbation, 7% caused oropharyngeal irritation, pre-med with albuterol   - Has standing Q68month AFB and lower resp culture but typically cannot produce sputum  -06/01/21 Patient sputum culture with 4+ MRSA and AFB with M absesscus for which CT was obtained. No cavitary lesion at that time and has not been able to produce sputum since so have not treated for M abscessus. Treated with doxycycline with improvement. Repeated tx 12/2021.  - Due to her having grown M abscessus will not put azithromycin despite repeated exacerbations    # Health Maintenance  Covid-19: S/p J&J 4/6/2, Defers any future COVID vaccines   Influenza: Still deciding   Prevnar 20: Will give at next appt   Prenvar: s/p 12/02/2018, 12/02/2019  Tdap: UTD 12/02/2013  Lung cancer screening: Not a candidate    Immunization History   Administered Date(s) Administered    COVID-19 VACC,(JANSSEN)(PF) 05/27/2019    INFLUENZA INJ MDCK PF, QUAD,(FLUCELVAX)(23MO AND UP EGG FREE) 12/27/2015, 01/03/2018, 01/27/2019    INFLUENZA TIV (TRI) 23MO+ W/ PRESERV (IM) 12/02/2013, 12/08/2014    Influenza Virus Vaccine, unspecified formulation 12/03/2012    Influenza,MDCK, QUAD w/preservative(Flucelvax) 01/02/2017    Pneumococcal Conjugate 13-Valent 12/02/2018, 12/02/2019    TdaP 12/02/2013       This patient was seen and discussed with attending physician Dr. Redgie Grayer  who agrees with the assessment and plan above.    CC: Gauger, Hermenia Fiscal, FNP    HISTORY:     Interval History   Patient is feeling much improved since her hospitalization. She has minimal shortness of breath. Much improved cough, still with VERY little sputum production despite doing airway clearance 3x a day. 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 She does report some tonsillar irritation ever  since being on the tobra, describes it as s tickle in her throat.     History of Present Illness (02/12/2019):  Ms. Aronson is a 72 y.o. female with history of chronic rhinosinusitis, MS and GERD whom we are seeing in consultation for evaluation of abnormal CT scan and cough. Ms. Stuckwisch reports she has had a cough for about the past year. Cough seems to wax and wane, although has become more persistent and irritating. Cough does seem to be worse with seasonal changes / allergy flares. Not worsened with PO intake and no concern for aspiration. Has minimal GERD symptoms. Cough is often dry, but notes intermittently and more recently, she is able to cough up sputum with is thick yellow/green, sticky and foul-smelling. She notes she has chronic rhinosinusitis with obvious post-nasal drip; her therapy has recently been stepped up to steroid rinses for this. She does query if some of what she coughs up is this drainage. She reports she'd have mild-to-moderate SOB with heavier exertion, although no SOB with routine activities. She denies fevers, chills, sweats.    She notes given persistence and worsening of cough, a CXR was obtained which was abnormal, and led to a CT scan completed earlier this month which demonstrated multiple pulmonary nodules (see objective section below for interpretation). She was referred to [place] for further evaluation.     She does not smoke, but notes she has had significant second hand smoke exposure taking care of her mother over the years who was a heavy smoker. No inhaled substances. No known occupational exposures. Denies significant childhood illnesses, although notes for past several years, as above, has had recurrent sinus infections for which she follows with ENT. Notes she infrequently has chest colds, but has never had a severe respiratory illness. No known bronchiectasis in family. She has had multiple children. Family history notable for asthma and acute eosinophilic pneumonia of unclear etiology in her sister. Her mother had COPD associated with significant smoking. No family history of lung cancer, although this is a big concern for Ms. Jacquet given her second hand smoke exposure. No known history of rheumatologic diseases. She has MS, which she states has been slowly progressive, and she does not require any directed therapy for this (has been on no immunosuppression).    Interval Update 10/18/20  Since her last visit in clinic Ms. Kemnitz completed one course of levaquin for cough and congestion. She was also treated for COVID with paxlovid with only mild sx. She has otherwise been doing well. She received a percussive vest from [facility] and has been using this in place of her nebulizer which she much prefers. Still not producing much mucus. No fevers. Able to sing in chorus.     Interval Update 04/04/21  Ms. Cusic has been feeling great. She does huff cough daily, she also has a nebulizer and will do albuterol and 3% saline via [place] if she has a cold or notices more sputum. Denies any other complaints. No sputum production and stable cough. Able to sing in choir.     Interval Update 09/26/2021   She has been using her albuterol and HTS saline twice a day with her [place]. When she does cough up sputum it is no longer foul smelling. She brought a sample 7/17 after she was around a sick grandchild but it seems to have been lost. Her sputum has returned to normal but she is having a very raw sore feeling in her chest.     01/02/22  Started having cough with scant light green sputum production one week ago. Has also been having nightly fevers. Last night was her first night without a fever and she feels slightly better than early last week. She was recently around family who developed resp sx several days ago. COVID NEG x2 at home. She does not feel that she can produce sputum today.    04/10/21  Ms. Biagioni has had some congestion that feels more like it is getting stuck in her throat. No chest congestion or fullness. No wheezing or shortness of breath. Still singing.     PMH: Chronic rhinosinusitis / allergic rhinitis  Hypothyroidism  Migraines  Multiple sclerosis -- slowly progressive, diagnosed 30 yrs ago, on no medications, no recent exacerbations  Mitral valve Prolapse  GERD, occasional     SurgHx: Appendectomy  Hysterectomy  Tubal ligation     FH: Father (deceased): Alzheimer's Disease, EtOH abuse, stroke  Mother: COPD (assocated with smoking), Migraines, glaucoma, skin cancer  Sister: Asthma, eosinophilic pneumonia, pan-sinusitis  Maternal aunt: Ovarian cancer, throat cancer, pancreatic cancer  MGM: Ovarian Cancer, DM     SocHx: Tobacco: Never, although significant second hand exposure  EtOH: Denies  Drugs: Denies  Living situation: Lives in [place], [place] built 2017, home treated for mold growth underneath house in crawl space a couple of years ago  Occupational: More recently office clerical work, 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 Pertinent pulmonary meds:  Budesonide - nasal/sinus rinses     Allergies: Allergies   Allergen Reactions    Keflex [Cephalexin] Hives    Doxycycline Nausea Only    Erythromycin Nausea Only    Sulfa (Sulfonamide Antibiotics) Rash      ROS: 12-point ROS reviewed with patient with pertinent positives and negatives as per HPI.       PHYSICAL EXAM:     Vitals:    04/10/22 1051   BP: 123/78   Pulse: 55   Temp: 36.2 ??C (97.2 ??F)   SpO2: 93%      General: No acute distress   Eyes: EOMI, sclera anicteric  CV: RRR, no m/r/g  Lungs: CTAB   Abd: ND  Ext: no clubbing, no cyanosis  Skin: No rashes  Neuro: No focal deficits, alert & oriented    LABORATORY and RADIOLOGY DATA:     PFTs        Pertinent Laboratory Data:    06/13/21 OPF   06/01/21 MSSA  08/2021 brought in a sample but it was not run for an unclear reason     Pertinent Imaging Data:  CT 04/2019:  IMPRESSION:   Interval improvement of patchy and nodular pulmonary parenchymal opacities with residual nodular opacities. Lingular bronchiectasis. Findings are most likely representing endobronchial infection such as MAI. Recommend 3-6 month follow-up chest CT for further evaluation.    CT 08/2019  IMPRESSION:  Persistent bronchiectasis, small airway inflammation and associated peripheral respiratory bronchiolitis, with mild increase in the bronchiolitis in the peripheral right lower lobe. 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    CT 12/2019  IMPRESSION:   Mild lingular bronchiectasis, and clustered nodular pulmonary parenchymal opacities most likely representing indolent small airway infection. Overall the appearance is grossly unchanged from prior studies.    CT 2/22  IMPRESSION:  Unchanged bronchiectasis within the lingula and medial right middle lobe as well as multiple clusters of centrilobular pulmonary nodules, which can be  seen in the setting of indolent small airway infection.

## 2022-04-10 ENCOUNTER — Ambulatory Visit
Admit: 2022-04-10 | Discharge: 2022-04-11 | Payer: MEDICARE | Attending: Student in an Organized Health Care Education/Training Program | Primary: Student in an Organized Health Care Education/Training Program

## 2022-04-10 DIAGNOSIS — J479 Bronchiectasis, uncomplicated: Principal | ICD-10-CM

## 2022-04-10 NOTE — Unmapped (Signed)
Thank you for allowing me to be a part of your care. Please call the clinic with any questions.    Kiowa Peifer, MD     Between appointments, you can reach us at these numbers:    For appointments or the Pulmonary Nurse: [phone], Fax: [phone]  For emergency issues after hours: Hospital Operator: [phone], ask for Pulmonary Fellow on call  [facility]: [phone]  Pharmacy Assistance Program: [phone]    Important Links:  How to use inhaler videos: COPD Inhaler Educational Video Series - [employer]   Pulmonary rehab: Pulmonary Rehabilitation - Department of Medicine ([email or web address])   Smoking cessation: Smoking Cessation - Department of Medicine [email or web address])

## 2022-04-10 NOTE — Unmapped (Signed)
Teaching Physician Attestation:   I personally saw and evaluated the patient with the pulmonary fellow, participating in the key portions of the service including personally obtaining history of present illness, examination of the patient and direct involvement in development of plan of care.  I have reviewed the fellow???s note and agree with the fellow's findings and plan. Additional comments are as follows:    Briefly, Victoria Copeland is a 72 y.o. female a history of bronchiectasis with prior exacerbations requiring treatment for pseudomonas.  Clinically doing well today, but with some upper chest congestion possibly due not using airway clearance recently.  I do not suspect an exacerbation of her bronchiectasis at this time, she remains without dyspnea and is able to participate in her singing activities.  She agrees to go back to using airway clearance with HTS to help clear up the upper chest congestion.    Laboratory data and radiographic imaging described above were personally reviewed personally by me in association with today's clinic encounter.    As part of this visit, I personally reviewed prior external notes from [facility] and referral documentation on the day of encounter.     I provided independent interpretation of tests performed by other providers (including CT imaging) obtained since the last visit.     All pre/post time was on the same day of the appointment.    I am located on-site and the patient is located on-site for this visit.      I personally spent 15 minutes face-to-face and non-face-to-face in the care of this patient, which includes all pre, intra, and post visit time on the date of service.    Karene Fry, MD  Division of Pulmonary and Critical Care Medicine  04/10/22  11:44 AM

## 2022-05-23 ENCOUNTER — Other Ambulatory Visit: Payer: Self-pay | Admitting: Nurse Practitioner

## 2022-05-23 DIAGNOSIS — Z1231 Encounter for screening mammogram for malignant neoplasm of breast: Secondary | ICD-10-CM

## 2022-06-26 ENCOUNTER — Ambulatory Visit
Admission: RE | Admit: 2022-06-26 | Discharge: 2022-06-26 | Disposition: A | Discharge status: Home / Self Care | Payer: Medicare Other | Source: Ambulatory Visit | Attending: Nurse Practitioner | Admitting: Nurse Practitioner

## 2022-06-26 DIAGNOSIS — Z1231 Encounter for screening mammogram for malignant neoplasm of breast: Secondary | ICD-10-CM | POA: Diagnosis not present

## 2022-07-27 ENCOUNTER — Ambulatory Visit (INDEPENDENT_AMBULATORY_CARE_PROVIDER_SITE_OTHER): Payer: Medicare Other

## 2022-07-27 ENCOUNTER — Ambulatory Visit
Admission: EM | Admit: 2022-07-27 | Discharge: 2022-07-27 | Disposition: A | Discharge status: Home / Self Care | Payer: Medicare Other

## 2022-07-27 DIAGNOSIS — J069 Acute upper respiratory infection, unspecified: Secondary | ICD-10-CM

## 2022-07-27 DIAGNOSIS — J15211 Pneumonia due to Methicillin susceptible Staphylococcus aureus: Principal | ICD-10-CM

## 2022-07-27 MED ORDER — DOXYCYCLINE HYCLATE 100 MG CAPSULE
ORAL_CAPSULE | Freq: Two times a day (BID) | ORAL | 0 refills | 14 days | Status: CP
Start: 2022-07-27 — End: ?

## 2022-07-27 MED ORDER — IPRATROPIUM BROMIDE 0.06 % NA SOLN: 2.0000 | Freq: Four times a day (QID) | NASAL | 12 refills | Status: AC

## 2022-07-27 MED ORDER — BENZONATATE 100 MG PO CAPS: 200.0000 mg | ORAL_CAPSULE | Freq: Three times a day (TID) | ORAL | 0 refills | Status: AC

## 2022-07-27 MED ORDER — PROMETHAZINE-DM 6.25-15 MG/5ML PO SYRP
5.0000 mL | ORAL_SOLUTION | Freq: Four times a day (QID) | ORAL | 0 refills | Status: AC | PRN
Start: 2022-07-27 — End: ?

## 2022-07-27 NOTE — Discharge Instructions (Addendum)
Your chest x-ray did not show any signs of pneumonia.  Your exam is most consistent with a respiratory infection and it is most likely viral given the short duration.  Please use the following medications to help you with your symptoms.  Use the Atrovent nasal spray, 2 squirts in each nostril every 6 hours, as needed for runny nose and postnasal drip.  Use the Tessalon Perles every 8 hours during the day.  Take them with a small sip of water.  They may give you some numbness to the base of your tongue or a metallic taste in your mouth, this is normal.  Use the Promethazine DM cough syrup at bedtime for cough and congestion.  It will make you drowsy so do not take it during the day.  Return for reevaluation or see your primary care provider for any new or worsening symptoms.

## 2022-07-27 NOTE — ED Triage Notes (Signed)
Pt c/o cough,wheezing,sob & congestion x4 days. Hx of bronchiectasis. Has tried nebulizer tx w/o relief.

## 2022-07-27 NOTE — ED Provider Notes (Signed)
[facility]    CSN: [id number] Arrival date & time: 07/27/22  1409      History   Chief Complaint Chief Complaint  Patient presents with   Cough   Congestion    HPI Cypress Leis is a 72 y.o. female.   HPI  72 year old female with a history of MS, bronchiectasis, MVP, hypothyroidism, migraine headaches, and palpitations presents for evaluation of 4 days worth of respiratory complaints to include nasal congestion with sinus pressure and green nasal discharge, cough productive for green sputum, shortness of breath, and wheezing.  She states she has had an elevated temp of 99 at home and she is currently 4 here in clinic.  She is followed by pulmonology at [facility] who ordered a sputum culture but she has been unable to expectorate a sample.  Past Medical History:  Diagnosis Date   Cancer (HCC)    skin ca   Dental bridge present permanent - top right   Hypothyroidism    Migraine headache    approx 1x/mo for 3 days   MS (multiple sclerosis) (HCC)    MVP (mitral valve prolapse)    mild, dx'd "many yrs ago". no issues   Palpitations     There are no problems to display for this patient.   Past Surgical History:  Procedure Laterality Date   ABDOMINAL HYSTERECTOMY     ABDOMINAL SURGERY     Quintella Reichert OSTEOTOMY Left 11/15/2017   Procedure: Quintella Reichert OSTEOTOMY;  Surgeon: Recardo Evangelist, DPM;  Location: [facility];  Service: Podiatry;  Laterality: Left;  mini monster paragon screws and staples lma with local   HALLUX VALGUS AUSTIN Left 11/15/2017   Procedure: HALLUX VALGUS AUSTIN;  Surgeon: Recardo Evangelist, DPM;  Location: [facility];  Service: Podiatry;  Laterality: Left;    OB History   No obstetric history on file.      Home Medications    Prior to Admission medications   Medication Sig Start Date End Date Taking? Authorizing Provider  albuterol (PROVENTIL) (5 MG/ML) 0.5% nebulizer solution Inhale into the lungs. 04/02/20  Yes  [provider]  amitriptyline (ELAVIL) 10 MG tablet Take 10 mg by mouth at bedtime.   Yes [provider]  aspirin 81 MG tablet Take 81 mg by mouth daily.   Yes [provider]  benzonatate (TESSALON) 100 MG capsule Take 2 capsules (200 mg total) by mouth every 8 (eight) hours. 07/27/22  Yes Becky Augusta, NP  Calcium-Vitamin D-Vitamin K (VIACTIV PO) Take by mouth daily.   Yes [provider]  Cholecalciferol (VITAMIN D PO) Take by mouth daily.   Yes [provider]  conjugated estrogens (PREMARIN) vaginal cream Place 1 Applicatorful vaginally daily.   Yes [provider]  dexamethasone 0.5 MG/5ML elixir Take 5 mLs by mouth daily.   Yes [provider]  ipratropium (ATROVENT) 0.06 % nasal spray Place 2 sprays into both nostrils 4 (four) times daily. 07/27/22  Yes Becky Augusta, NP  levothyroxine (SYNTHROID, LEVOTHROID) 50 MCG tablet Take 50 mcg by mouth daily before breakfast.   Yes [provider]  Multiple Vitamins-Minerals (CENTRUM SILVER PO) Take 0.5 tablets by mouth daily.   Yes [provider]  oxyCODONE-acetaminophen (PERCOCET) 7.5-325 MG tablet Take 1 tablet by mouth every 4 (four) hours as needed for severe pain. 11/15/17  Yes Troxler, Molli Hazard, DPM  promethazine-dextromethorphan (PROMETHAZINE-DM) 6.25-15 MG/5ML syrup Take 5 mLs by mouth 4 (four) times daily as needed. 07/27/22  Yes Becky Augusta,  NP  propranolol ER (INDERAL LA) 80 MG 24 hr capsule Take 80 mg by mouth daily.   Yes [provider]    Family History Family History  Problem Relation Age of Onset   Breast cancer Neg Hx     Social History Social History   Tobacco Use   Smoking status: Never   Smokeless tobacco: Never  Vaping Use   Vaping Use: Never used  Substance Use Topics   Alcohol use: No   Drug use: Never     Allergies   Cephalexin, Cefdinir, Cephalothin, Erythromycin, and Sulfa antibiotics   Review of Systems Review of  Systems  Constitutional:  Positive for fever.  HENT:  Positive for congestion, rhinorrhea and sore throat. Negative for ear pain.   Respiratory:  Positive for cough, shortness of breath and wheezing.      Physical Exam Triage Vital Signs ED Triage Vitals  Enc Vitals Group     BP 07/27/22 1414 110/73     Pulse Rate 07/27/22 1414 75     Resp 07/27/22 1414 16     Temp 07/27/22 1414 99 F (37.2 C)     Temp Source 07/27/22 1414 Oral     SpO2 07/27/22 1414 96 %     Weight 07/27/22 1413 128 lb (58.1 kg)     Height 07/27/22 1413 5\' 1"  (1.549 m)     Head Circumference --      Peak Flow --      Pain Score --      Pain Loc --      Pain Edu? --      Excl. in GC? --    No data found.  Updated Vital Signs BP 110/73 (BP Location: Left Arm)   Pulse 75   Temp 99 F (37.2 C) (Oral)   Resp 16   Ht 5\' 1"  (1.549 m)   Wt 128 lb (58.1 kg)   SpO2 96%   BMI 24.19 kg/m   Visual Acuity Right Eye Distance:   Left Eye Distance:   Bilateral Distance:    Right Eye Near:   Left Eye Near:    Bilateral Near:     Physical Exam Vitals and nursing note reviewed.  Constitutional:      Appearance: Normal appearance. She is not ill-appearing.  HENT:     Head: Normocephalic and atraumatic.     Nose: Congestion and rhinorrhea present.     Comments: Nasal mucosa is mildly erythematous and edematous with scant clear discharge in both nares.  Patient does have tenderness to compression of bilateral frontal and maxillary sinuses.    Mouth/Throat:     Mouth: Mucous membranes are moist.     Pharynx: Oropharynx is clear. Posterior oropharyngeal erythema present. No oropharyngeal exudate.     Comments: Mild erythema without injection to the posterior oropharynx with clear postnasal drip. Cardiovascular:     Rate and Rhythm: Normal rate and regular rhythm.     Pulses: Normal pulses.     Heart sounds: Normal heart sounds. No murmur heard.    No friction rub. No gallop.  Pulmonary:     Effort:  Pulmonary effort is normal.     Breath sounds: Normal breath sounds. No wheezing, rhonchi or rales.  Musculoskeletal:     Cervical back: Normal range of motion and neck supple.  Lymphadenopathy:     Cervical: No cervical adenopathy.  Skin:    General: Skin is warm and dry.     Capillary Refill: Capillary  refill takes less than 2 seconds.     Findings: No erythema or rash.  Neurological:     General: No focal deficit present.     Mental Status: She is alert and oriented to person, place, and time.      UC Treatments / Results  Labs (all labs ordered are listed, but only abnormal results are displayed) Labs Reviewed - No data to display  EKG   Radiology DG Chest 2 View  Result Date: 07/27/2022 CLINICAL DATA:  Productive cough. EXAM: CHEST - 2 VIEW COMPARISON:  January 31, 2019. FINDINGS: The heart size and mediastinal contours are within normal limits. Minimal scarring is noted in the right perihilar and apical regions. Two small nodular densities are noted in the left midlung which were present on prior CT exam and most likely represents postinfectious scarring. The visualized skeletal structures are unremarkable. IMPRESSION: Two small nodular density seen in lateral portion of left mid lung which were present on CT scan of 2020 and most consistent with post infectious scarring. Right perihilar and apical scarring is noted as well. No definite acute abnormality is noted. Electronically Signed   By: Lupita Raider M.D.   On: 07/27/2022 15:34    Procedures Procedures (including critical care time)  Medications Ordered in UC Medications - No data to display  Initial Impression / Assessment and Plan / UC Course  I have reviewed the triage vital signs and the nursing notes.  Pertinent labs & imaging results that were available during my care of the patient were reviewed by me and considered in my medical decision making (see chart for details).   Patient is a pleasant,  nontoxic-appearing 72 year old female presenting for evaluation of 4 days with respiratory complaints as outlined in HPI above.  She does have an elevated temp of 99 here in clinic but no fever.  Her respiratory rate is 16 and her room air oxygen saturation is 96%.  She is able to speak in full sentences without dyspnea or tachypnea.  She does have a history of bronchiectasis and reports that she has had a mixture of upper and lower respiratory symptoms and is coughing up green sputum.  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  She is followed by pulmonology in [facility] and they have ordered a sputum culture but she has been unable to provide a specimen.  Her lungs are clear to auscultation and she has normal chest excursion.  However, given that she has a significant pulmonary history and has been coughing up purulent sputum I will obtain a chest x-ray to rule out any acute cardiopulmonary pathology.  Chest x-ray independently reviewed and evaluated by me.  Impression: No definitive infiltrate or effusion noted.  Lung fields are well-pneumatized.  Radiology overread is pending. Radiology impression states there is 2 small nodular densities in the lateral portion left midlung which were present on CT scan from 2020 and is consistent with postinfectious scarring.  Right perihilar and apical scarring is noted as well.  No acute abnormality noted.  I will discharge patient on the diagnosis of viral URI with cough.  I will prescribe Atrovent nasal spray help with nasal congestion along with Tessalon Perles and Promethazine DM cough syrup.  She does have an appointment to follow-up with pulmonology in 1 week.  If her symptoms worsen or she starts running fever she can return for reevaluation.  Final Clinical Impressions(s) / UC Diagnoses   Final diagnoses:  Viral URI  with cough     Discharge Instructions      Your chest x-ray did not show any signs of pneumonia.  Your  exam is most consistent with a respiratory infection and it is most likely viral given the short duration.  Please use the following medications to help you with your symptoms.  Use the Atrovent nasal spray, 2 squirts in each nostril every 6 hours, as needed for runny nose and postnasal drip.  Use the Tessalon Perles every 8 hours during the day.  Take them with a small sip of water.  They may give you some numbness to the base of your tongue or a metallic taste in your mouth, this is normal.  Use the Promethazine DM cough syrup at bedtime for cough and congestion.  It will make you drowsy so do not take it during the day.  Return for reevaluation or see your primary care provider for any new or worsening symptoms.      ED Prescriptions     Medication Sig Dispense Auth. Provider   benzonatate (TESSALON) 100 MG capsule Take 2 capsules (200 mg total) by mouth every 8 (eight) hours. 21 capsule Becky Augusta, NP   ipratropium (ATROVENT) 0.06 % nasal spray Place 2 sprays into both nostrils 4 (four) times daily. 15 mL Becky Augusta, NP   promethazine-dextromethorphan (PROMETHAZINE-DM) 6.25-15 MG/5ML syrup Take 5 mLs by mouth 4 (four) times daily as needed. 118 mL Becky Augusta, NP      PDMP not reviewed this encounter.   Becky Augusta, NP 07/27/22 1539

## 2022-07-28 NOTE — Unmapped (Signed)
Pulmonary Clinic - Return Visit    Referring Physician :  Myrene Buddy, FNP  PCP:     Myrene Buddy, FNP  Reason for Consult:   Abnormal CT scan with pulmonary nodules, cough    ASSESSMENT and PLAN     Victoria Copeland is a 72 y.o. female with history of chronic rhinosinusitis, MS and GERD whom we are seeing for routine follow up of bronchiectasis.     # Bronchiectasis with bilateral Infiltrates, tree-in-bud nodules   - CT 01/2019  R>L bronchiectasis, more prominent in bilateral upper lobes and lingula/RML. Additionally, had bilateral solid and mixed nodules with some tree-in-bud. CT completed 08/2019 demonstrated overall  improvement in opacities, as well as improvement in FEV1 by spirometry.   - As far as etiology of bronchiectasis, unclear; no hypogammaglobulinemia, normal IgE, HIV negative, CF testing normal, alpha one testing normal  - Based on 01/05/2020 PFTs and fevers bronchoscopy was performed which showed PSA, penicillium, and actinomyces  - Decision to treat with levaquin for 14 days and inhaled tobramycin for 28 days however patient continued to have worsening sx and PFTs     - Patient admitted, IV ceftaz for 10 days, completed 30 day course of tobramycin    - CT 04/07/20 overall improved/stable and PFTs 02/26/20 improved after     hospitalization  - Completed trial of prednisone course due to peripheral eos, felt improved but did have poor sleep and increased appetite. Discontinued.    -Albuterol with 3% HTS with aerobika TID at first sx of congestion or exacerbation, 7% caused oropharyngeal irritation, pre-med with albuterol. Otherwise she is using 1-2 a day.  - Has standing Q41month AFB and lower resp culture but typically cannot produce sputum  -06/01/21 Patient sputum culture with 4+ MRSA and AFB with M absesscus for which CT was obtained. No cavitary lesion at that time and has not been able to produce sputum since so have not treated for M abscessus. Treated with doxycycline with improvement. Repeated tx 12/2021 and 07/2022 after returning from a cruise with fevers and increased sputum.   - Due to her having grown M abscessus and exacerbation frequency is only about one a year, will not put azithromycin   - Induced sputum 08/02/22 with HTS and albuterol in clinic > not able to produce a specimen   - She uses saline rinse and budesonide rinses for her sinus disease     # Health Maintenance  Covid-19: S/p J&J 4/6/2, Defers any future COVID vaccines   Influenza: Still deciding   Prevnar 20: Will give at next appt   Prenvar: s/p 12/02/2018, 12/02/2019  Tdap: UTD 12/02/2013  Lung cancer screening: Not a candidate    Immunization History   Administered Date(s) Administered    COVID-19 VACC,(JANSSEN)(PF) 05/27/2019    INFLUENZA INJ MDCK PF, QUAD,(FLUCELVAX)(62MO AND UP EGG FREE) 12/27/2015, 01/03/2018, 01/27/2019    INFLUENZA QUAD ADJUVANTED 22YR UP(FLUAD) 02/02/2022    INFLUENZA TIV (TRI) 62MO+ W/ PRESERV (IM) 12/02/2013, 12/08/2014    Influenza Vaccine Quad(IM)6 MO-Adult(PF) 12/28/2020    Influenza Virus Vaccine, unspecified formulation 12/03/2012    Influenza,MDCK, QUAD w/preservative(Flucelvax) 01/02/2017    PNEUMOCOCCAL POLYSACCHARIDE 23-VALENT 12/19/2019    Pneumococcal Conjugate 13-Valent 12/02/2018, 12/02/2019    TdaP 12/02/2013       This patient was seen and discussed with attending physician Dr. Doralee Albino who agrees with the assessment and plan above.    CC: Gauger, Victoria Fiscal, FNP    HISTORY:     Interval History  Patient is feeling much improved since her hospitalization. She has minimal shortness of breath. Much improved cough, still with VERY little sputum production despite doing airway clearance 3x a day. 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 She does report some tonsillar irritation ever since being on the tobra, describes it as s tickle in her throat.     History of Present Illness (02/12/2019):  Victoria Copeland is a 72 y.o. female with history of chronic rhinosinusitis, MS and GERD whom we are seeing in consultation for evaluation of abnormal CT scan and cough. Victoria Copeland reports she has had a cough for about the past year. Cough seems to wax and wane, although has become more persistent and irritating. Cough does seem to be worse with seasonal changes / allergy flares. Not worsened with PO intake and no concern for aspiration. Has minimal GERD symptoms. Cough is often dry, but notes intermittently and more recently, she is able to cough up sputum with is thick yellow/green, sticky and foul-smelling. She notes she has chronic rhinosinusitis with obvious post-nasal drip; her therapy has recently been stepped up to steroid rinses for this. She does query if some of what she coughs up is this drainage. She reports she'd have mild-to-moderate SOB with heavier exertion, although no SOB with routine activities. She denies fevers, chills, sweats.    She notes given persistence and worsening of cough, a CXR was obtained which was abnormal, and led to a CT scan completed earlier this month which demonstrated multiple pulmonary nodules (see objective section below for interpretation). She was referred to [place] for further evaluation.     She does not smoke, but notes she has had significant second hand smoke exposure taking care of her mother over the years who was a heavy smoker. No inhaled substances. No known occupational exposures. Denies significant childhood illnesses, although notes for past several years, as above, has had recurrent sinus infections for which she follows with ENT. Notes she infrequently has chest colds, but has never had a severe respiratory illness. No known bronchiectasis in family. She has had multiple children. Family history notable for asthma and acute eosinophilic pneumonia of unclear etiology in her sister. Her mother had COPD associated with significant smoking. No family history of lung cancer, although this is a big concern for Victoria Copeland given her second hand smoke exposure. No known history of rheumatologic diseases. She has MS, which she states has been slowly progressive, and she does not require any directed therapy for this (has been on no immunosuppression).    Interval Update 10/18/20  Since her last visit in clinic Victoria Copeland completed one course of levaquin for cough and congestion. She was also treated for COVID with paxlovid with only mild sx. She has otherwise been doing well. She received a percussive vest from [employer] and has been using this in place of her nebulizer which she much prefers. Still not producing much mucus. No fevers. Able to sing in chorus.     Interval Update 04/04/21  Victoria Copeland has been feeling great. She does huff cough daily, she also has a nebulizer and will do albuterol and 3% saline via [place] if she has a cold or notices more sputum. Denies any other complaints. No sputum production and stable cough. Able to sing in choir.     Interval Update 09/26/2021   She has been using her albuterol and HTS saline twice a day with her [place]. When  she does cough up sputum it is no longer foul smelling. She brought a sample 7/17 after she was around a sick grandchild but it seems to have been lost. Her sputum has returned to normal but she is having a very raw sore feeling in her chest.     01/02/22  Started having cough with scant light green sputum production one week ago. Has also been having nightly fevers. Last night was her first night without a fever and she feels slightly better than early last week. She was recently around family who developed resp sx several days ago. COVID NEG x2 at home. She does not feel that she can produce sputum today.    04/10/21  Victoria Copeland has had some congestion that feels more like it is getting stuck in her throat. No chest congestion or fullness. No wheezing or shortness of breath. Still singing.     08/02/22  Patient returned from [place] with increased cough with sputum production and fever. Prescribed 7 day course of doxycycline 6/6. She initially presented to urgent care with a sinus infection. She has increased her airway clearance to three times a day. She is getting sputum with airway clearance that has a slight foul order.     PMH: Chronic rhinosinusitis / allergic rhinitis  Hypothyroidism  Migraines  Multiple sclerosis -- slowly progressive, diagnosed 30 yrs ago, on no medications, no recent exacerbations  Mitral valve Prolapse  GERD, occasional     SurgHx: Appendectomy  Hysterectomy  Tubal ligation     FH: Father (deceased): Alzheimer's Disease, EtOH abuse, stroke  Mother: COPD (assocated with smoking), Migraines, glaucoma, skin cancer  Sister: Asthma, eosinophilic pneumonia, pan-sinusitis  Maternal aunt: Ovarian cancer, throat cancer, pancreatic cancer  MGM: Ovarian Cancer, DM     SocHx: Tobacco: Never, although significant second hand exposure  EtOH: Denies  Drugs: Denies  Living situation: Lives in [place], hosue built 2017, home treated for mold growth underneath house in crawl space a couple of years ago  Occupational: More recently office clerical work, 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 Pertinent pulmonary meds:  Budesonide - nasal/sinus rinses     Allergies: Allergies   Allergen Reactions    Keflex [Cephalexin] Hives    Erythromycin Nausea Only    Sulfa (Sulfonamide Antibiotics) Rash      ROS: 12-point ROS reviewed with patient with pertinent positives and negatives as per HPI.       PHYSICAL EXAM:     Vitals:    08/02/22 1026   BP: 111/69   Pulse: 56   Temp: 36.2 ??C (97.1 ??F)   SpO2: 99%        General: No acute distress   Eyes: EOMI, sclera anicteric  CV: RRR, no m/r/g  Lungs: CTAB   Abd: ND  Ext: no clubbing, no cyanosis  Skin: No rashes  Neuro: No focal deficits, alert & oriented    LABORATORY and RADIOLOGY DATA:     PFTs        Pertinent Laboratory Data:    06/13/21 OPF   06/01/21 MSSA  08/2021 brought in a sample but it was not run for an unclear reason     Pertinent Imaging Data:  CT 04/2019:  IMPRESSION:   Interval improvement of patchy and nodular pulmonary parenchymal opacities with residual nodular opacities. Lingular bronchiectasis. Findings are most likely representing endobronchial infection such as MAI. Recommend 3-6 month follow-up chest  CT for further evaluation.    CT 08/2019  IMPRESSION:  Persistent bronchiectasis, small airway inflammation and associated peripheral respiratory bronchiolitis, with mild increase in the bronchiolitis in the peripheral right lower lobe. 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    CT 12/2019  IMPRESSION:   Mild lingular bronchiectasis, and clustered nodular pulmonary parenchymal opacities most likely representing indolent small airway infection. Overall the appearance is grossly unchanged from prior studies.    CT 2/22  IMPRESSION:  Unchanged bronchiectasis within the lingula and medial right middle lobe as well as multiple clusters of centrilobular pulmonary nodules, which can be seen in the setting of indolent small airway infection.

## 2022-08-02 ENCOUNTER — Ambulatory Visit
Admit: 2022-08-02 | Discharge: 2022-08-02 | Payer: MEDICARE | Attending: Student in an Organized Health Care Education/Training Program | Primary: Student in an Organized Health Care Education/Training Program

## 2022-08-02 ENCOUNTER — Ambulatory Visit: Admit: 2022-08-02 | Discharge: 2022-08-02 | Payer: MEDICARE

## 2022-08-02 DIAGNOSIS — J471 Bronchiectasis with (acute) exacerbation: Principal | ICD-10-CM

## 2022-08-02 MED ORDER — ANORO ELLIPTA 62.5 MCG-25 MCG/ACTUATION POWDER FOR INHALATION
Freq: Every day | RESPIRATORY_TRACT | 2 refills | 45 days | Status: CP
Start: 2022-08-02 — End: 2022-08-02

## 2022-08-02 MED ADMIN — albuterol 2.5 mg /3 mL (0.083 %) nebulizer solution 2.5 mg: 2.5 mg | RESPIRATORY_TRACT | @ 15:00:00 | Stop: 2022-08-02

## 2022-08-02 MED ADMIN — sodium chloride 3 % NEBULIZER solution 4 mL: 4 mL | RESPIRATORY_TRACT | @ 15:00:00 | Stop: 2022-08-02

## 2022-08-02 NOTE — Unmapped (Signed)
Thank you for allowing me to be a part of your care. Please call the clinic with any questions.    Valina Maes, MD     Between appointments, you can reach us at these numbers:    For appointments or the Pulmonary Nurse: [phone], Fax: [phone]  For emergency issues after hours: Hospital Operator: [phone], ask for Pulmonary Fellow on call  [facility]: [phone]  Pharmacy Assistance Program: [phone]    Important Links:  How to use inhaler videos: COPD Inhaler Educational Video Series - [employer] [facility]   Pulmonary rehab: Pulmonary Rehabilitation - Department of Medicine [email or web address])   Smoking cessation: Smoking Cessation - Department of Medicine ([email or web address])

## 2022-08-02 NOTE — Unmapped (Signed)
I saw and evaluated the patient, participating in the key portions of the service.  I reviewed the resident’s note.  I agree with the resident’s findings and plan. Ketra Duchesne N Kinsler Soeder, MD

## 2022-08-02 NOTE — Unmapped (Signed)
Sputum induction performed with Albuterol and Sodium Chloride 3%. Treatment was Well tolerated  , and not able to produce a specimen for testing.

## 2022-08-07 DIAGNOSIS — J479 Bronchiectasis, uncomplicated: Principal | ICD-10-CM

## 2022-08-07 MED ORDER — ALBUTEROL SULFATE CONCENTRATE 2.5 MG/0.5 ML SOLUTION FOR NEBULIZATION
Freq: Three times a day (TID) | RESPIRATORY_TRACT | 3 refills | 20 days | Status: CP | PRN
Start: 2022-08-07 — End: 2022-11-05

## 2022-08-07 MED ORDER — SODIUM CHLORIDE 3 % FOR NEBULIZATION
Freq: Three times a day (TID) | RESPIRATORY_TRACT | 5 refills | 75 days | Status: CP
Start: 2022-08-07 — End: ?
  Filled 2022-08-09: qty 840, 70d supply, fill #0

## 2022-08-07 NOTE — Unmapped (Signed)
Requested Prescriptions     Pending Prescriptions Disp Refills    sodium chloride 3 % NEBULIZER solution 900 mL 5     Sig: Inhale 4 mL by nebulization every eight (8) hours.

## 2022-08-08 DIAGNOSIS — J479 Bronchiectasis, uncomplicated: Principal | ICD-10-CM

## 2022-08-08 MED ORDER — ALBUTEROL SULFATE 2.5 MG/3 ML (0.083 %) SOLUTION FOR NEBULIZATION
Freq: Three times a day (TID) | RESPIRATORY_TRACT | 11 refills | 20 days | PRN
Start: 2022-08-08 — End: 2022-09-07

## 2022-08-08 NOTE — Unmapped (Signed)
Already filled

## 2022-10-25 ENCOUNTER — Ambulatory Visit: Admit: 2022-10-25 | Discharge: 2022-10-26 | Payer: MEDICARE

## 2022-10-25 DIAGNOSIS — J479 Bronchiectasis, uncomplicated: Principal | ICD-10-CM

## 2022-10-25 NOTE — Unmapped (Addendum)
It was lovely to see you today! Here is what we talked about:    - I strongly recommend you resuming at least twice a day airway clearance  - If you have increased cough, increased sputum volume or collar changes, increased SOB, hemoptysis, please call for treatment of an exacerbation  - I recommend getting a foam wedge to help prop you up under your bed  - Recommend regular exercise regimen (20-30 min 3 times per week); pick something that you enjoy doing be it walking, swimming, hiking, etc (anything that increases the depth and frequency of your breathing)    You will follow up with me in about 3 months for routine care. Please feel free to reach out to me through the clinic phone number [phone])  or via My Chart with any questions or concerns that may emerge.     Sincerely,  Heron Sabins, MD

## 2022-10-25 NOTE — Unmapped (Signed)
Pulmonary Clinic - Return Visit    Referring Physician :  Myrene Buddy, FNP  PCP:     Myrene Buddy, FNP  Reason for Consult:   Bronchiectasis    ASSESSMENT and PLAN     Ms. Burpee is a 72 y.o. female with history of chronic rhinosinusitis, MS and GERD whom we are seeing for routine follow up of bronchiectasis.     Bronchiectasis with bilateral Infiltrates, tree-in-bud nodules   No clear etiology of bronchiectasis: no hypogammaglobulinemia, normal IgE, HIV negative, CF testing normal, alpha one testing normal    Course of Disease:  - CT 01/2019  R>L bronchiectasis, more prominent in bilateral upper lobes and lingula/RML. Additionally, had bilateral solid and mixed nodules with some tree-in-bud. CT completed 08/2019 demonstrated overall  improvement in opacities, as well as improvement in FEV1 by spirometry.   - Based on 01/05/2020 PFTs and fevers bronchoscopy was performed which showed PSA, penicillium, and actinomyces. Decision to treat with levaquin for 14 days and inhaled tobramycin for 28 days however patient continued to have worsening sx and PFTs. Patient admitted, IV ceftaz for 10 days, completed 30 day course of tobramycin  - CT 04/07/20 overall improved/stable and PFTs 02/26/20 improved after hospitalization. Completed trial of prednisone course due to peripheral eos, felt improved but did have poor sleep and increased appetite. Discontinued.   - 06/01/21 Patient sputum culture with 4+ MRSA and AFB with M absesscus for which CT was obtained. No cavitary lesion at that time and has not been able to produce sputum since so have not treated for M abscessus. Treated with doxycycline with improvement. Repeated tx 12/2021 and 07/2022 after returning from a cruise with fevers and increased sputum.   - Induced sputum 08/02/22 with HTS and albuterol in clinic > not able to produce a specimen; treated for exacerbation with doxycycline with improvement in symptoms    Today patient is not having a frequent exacerbation but does notice some junk in her chest. Not producing sputum (rarely does) but has not used airway clearance for the past month or two. Will start with resumption of airway clearance. If pt has an exacerbation and is not able to produce sputum, would consider repeat bronchoscopy, particularly given growth of M.abscessus on prior AFB (05/2021).    Treatment Plan:  - Encourage resumption of albuterol with 3% HTS with aerobika in-line TID    -  7% caused oropharyngeal irritation, pre-med with albuterol.   - Recommended foam wedge for upright positioning while asleep  - Recommended regular exercise regimen (30 min 3x/wk)  - Has standing Q14month AFB and lower resp culture but typically cannot produce sputum  - She uses saline rinse and budesonide rinses for her sinus disease   - Due to her having grown M abscessus and exacerbation frequency is only about one a year, will not put azithromycin     # Health Maintenance  Covid-19: S/p J&J 4/6/2, Defers any future COVID vaccines, will readdress at [facility]  Influenza: Still deciding; will readdress at fu  Prevnar: s/p 12/02/2018, 12/02/2019  Tdap: UTD 12/02/2013  Lung cancer screening: Not a candidate    Immunization History   Administered Date(s) Administered    COVID-19 VACC,(JANSSEN)(PF) 05/27/2019    INFLUENZA INJ MDCK PF, QUAD,(FLUCELVAX)(87MO AND UP EGG FREE) 12/27/2015, 01/03/2018, 01/27/2019    INFLUENZA QUAD ADJUVANTED 4YR UP(FLUAD) 02/02/2022    INFLUENZA TIV (TRI) 87MO+ W/ PRESERV (IM) 12/02/2013, 12/08/2014    Influenza Vaccine Quad(IM)6 MO-Adult(PF) 12/28/2020    Influenza  Virus Vaccine, unspecified formulation 12/03/2012    Influenza,MDCK, QUAD w/preservative(Flucelvax) 01/02/2017    PNEUMOCOCCAL POLYSACCHARIDE 23-VALENT 12/19/2019    Pneumococcal Conjugate 13-Valent 12/02/2018, 12/02/2019    TdaP 12/02/2013     This patient was seen and discussed with attending physician Dr. Doralee Albino who agrees with the assessment and plan above.    CC: Gauger, Hermenia Fiscal, FNP    HISTORY:     History of Present Illness (02/12/2019):  Ms. Ibrahim is a 72 y.o. female with history of chronic rhinosinusitis, MS and GERD whom we are seeing in consultation for evaluation of abnormal CT scan and cough. Ms. Salmi reports she has had a cough for about the past year. Cough seems to wax and wane, although has become more persistent and irritating. Cough does seem to be worse with seasonal changes / allergy flares. Not worsened with PO intake and no concern for aspiration. Has minimal GERD symptoms. Cough is often dry, but notes intermittently and more recently, she is able to cough up sputum with is thick yellow/green, sticky and foul-smelling. She notes she has chronic rhinosinusitis with obvious post-nasal drip; her therapy has recently been stepped up to steroid rinses for this. She does query if some of what she coughs up is this drainage. She reports she'd have mild-to-moderate SOB with heavier exertion, although no SOB with routine activities. She denies fevers, chills, sweats.    She notes given persistence and worsening of cough, a CXR was obtained which was abnormal, and led to a CT scan completed earlier this month which demonstrated multiple pulmonary nodules (see objective section below for interpretation). She was referred to [place] for further evaluation.     She does not smoke, but notes she has had significant second hand smoke exposure taking care of her mother over the years who was a heavy smoker. No inhaled substances. No known occupational exposures. Denies significant childhood illnesses, although notes for past several years, as above, has had recurrent sinus infections for which she follows with ENT. Notes she infrequently has chest colds, but has never had a severe respiratory illness. No known bronchiectasis in family. She has had multiple children. Family history notable for asthma and acute eosinophilic pneumonia of unclear etiology in her sister. Her mother had COPD associated with significant smoking. No family history of lung cancer, although this is a big concern for Ms. Vensel given her second hand smoke exposure. No known history of rheumatologic diseases. She has MS, which she states has been slowly progressive, and she does not require any directed therapy for this (has been on no immunosuppression).    Interval Update 10/18/20  Since her last visit in clinic Ms. Colao completed one course of levaquin for cough and congestion. She was also treated for COVID with paxlovid with only mild sx. She has otherwise been doing well. She received a percussive vest from [employer] and has been using this in place of her nebulizer which she much prefers. Still not producing much mucus. No fevers. Able to sing in chorus.     Interval Update 04/04/21  Ms. Gramajo has been feeling great. She does huff cough daily, she also has a nebulizer and will do albuterol and 3% saline via [place] if she has a cold or notices more sputum. Denies any other complaints. No sputum production and stable cough. Able to sing in choir.     Interval Update 09/26/2021   She has been using her albuterol and HTS saline twice a day with her [place].  When she does cough up sputum it is no longer foul smelling. She brought a sample 7/17 after she was around a sick grandchild but it seems to have been lost. Her sputum has returned to normal but she is having a very raw sore feeling in her chest.     01/02/22  Started having cough with scant light green sputum production one week ago. Has also been having nightly fevers. Last night was her first night without a fever and she feels slightly better than early last week. She was recently around family who developed resp sx several days ago. COVID NEG x2 at home. She does not feel that she can produce sputum today.    04/10/21  Ms. Gloor has had some congestion that feels more like it is getting stuck in her throat. No chest congestion or fullness. No wheezing or shortness of breath. Still singing.     08/02/22  Patient returned from [place] with increased cough with sputum production and fever. Prescribed 7 day course of doxycycline 6/6. She initially presented to urgent care with a sinus infection. She has increased her airway clearance to three times a day. She is getting sputum with airway clearance that has a slight foul order. Treated with doxycycline with good effect.    10/25/22  Has not been using airway clearance due to helping care for father-in-law who was recently hospitalized. Normally does at least twice a day. Admits its a hard discipline when you're feeling well. She has not had any exacerbations since the last visit but does note that this week she's felt a bit more junky with increased chest congestion. Continues to use sinus rinses.     PMH: Chronic rhinosinusitis / allergic rhinitis  Hypothyroidism  Migraines  Multiple sclerosis -- slowly progressive, diagnosed 30 yrs ago, on no medications, no recent exacerbations  Mitral valve Prolapse  GERD, occasional     SurgHx: Appendectomy  Hysterectomy  Tubal ligation     FH: Father (deceased): Alzheimer's Disease, EtOH abuse, stroke  Mother: COPD (assocated with smoking), Migraines, glaucoma, skin cancer  Sister: Asthma, eosinophilic pneumonia, pan-sinusitis  Maternal aunt: Ovarian cancer, throat cancer, pancreatic cancer  MGM: Ovarian Cancer, DM     SocHx: Tobacco: Never, although significant second hand exposure  EtOH: Denies  Drugs: Denies  Living situation: Lives [place], [place], home treated for mold growth underneath house in crawl space a couple of years ago  Occupational: More recently office clerical work, 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 Pertinent pulmonary meds:  Budesonide - nasal/sinus rinses     Allergies: Allergies   Allergen Reactions    Keflex [Cephalexin] Hives    Erythromycin Nausea Only    Sulfa (Sulfonamide Antibiotics) Rash      ROS: 12-point ROS reviewed with patient with pertinent positives and negatives as per HPI.       PHYSICAL EXAM:     Vitals:    10/25/22 1050   BP: 130/69   Pulse: 53   Temp: 36.2 ??C (97.1 ??F)   SpO2: 98%     General: No acute distress   Eyes: EOMI, sclera anicteric  CV: RRR, no m/r/g  Lungs: CTAB, perhaps very minorly decreased lung sound on R compared to L, scant L wheeze in LUL  Abd: ND  Ext: no clubbing, no cyanosis  Skin: No rashes  Neuro: No focal deficits, alert & oriented  LABORATORY and RADIOLOGY DATA:     PFTs        Pertinent Laboratory Data:  01/19/20 (bronchoscopy) 300k CFU PsA, AFB Penicillum species, 1+ Actinomyces (not israelii)  06/13/21 OPF   06/01/21 4+ MSSA, M.abscessus  08/2021 brought in a sample but it was not run for an unclear reason     Pertinent Imaging Data:  CT 04/2019:  IMPRESSION:   Interval improvement of patchy and nodular pulmonary parenchymal opacities with residual nodular opacities. Lingular bronchiectasis. Findings are most likely representing endobronchial infection such as MAI. Recommend 3-6 month follow-up chest CT for further evaluation.    CT 08/2019  IMPRESSION:  Persistent bronchiectasis, small airway inflammation and associated peripheral respiratory bronchiolitis, with mild increase in the bronchiolitis in the peripheral right lower lobe. 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    CT 12/2019  IMPRESSION:   Mild lingular bronchiectasis, and clustered nodular pulmonary parenchymal opacities most likely representing indolent small airway infection. Overall the appearance is grossly unchanged from prior studies.    CT 03/2020  IMPRESSION:  Unchanged bronchiectasis within the lingula and medial right middle lobe as well as multiple clusters of centrilobular pulmonary nodules, which can be seen in the setting of indolent small airway infection.    CT 06/2021  IMPRESSION:  Redemonstrated mild bronchiectasis within the lingula and right middle lobe as multifocal areas of clustered and branching nodularity, some of which are new compared to prior. 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 Correlate sputum analysis as indicated.     2v CXR 07/2022  IMPRESSION:  Two small nodular density seen in lateral portion of left mid lung   which were present on CT scan of 2020 and most consistent with post   infectious scarring. Right perihilar and apical scarring is noted as   well. No definite acute abnormality is noted.

## 2023-01-24 ENCOUNTER — Ambulatory Visit: Admit: 2023-01-24 | Discharge: 2023-01-24 | Payer: MEDICARE

## 2023-01-24 DIAGNOSIS — J471 Bronchiectasis with (acute) exacerbation: Principal | ICD-10-CM

## 2023-01-24 DIAGNOSIS — J15211 Pneumonia due to Methicillin susceptible Staphylococcus aureus: Principal | ICD-10-CM

## 2023-01-24 MED ORDER — DOXYCYCLINE HYCLATE 100 MG CAPSULE
ORAL_CAPSULE | Freq: Two times a day (BID) | ORAL | 0 refills | 14 days | Status: CP
Start: 2023-01-24 — End: 2023-02-07

## 2023-01-24 NOTE — Unmapped (Signed)
Pulmonary Clinic - Return Visit    Referring Physician :  Myrene Buddy, FNP  PCP:     Myrene Buddy, FNP  Reason for Consult:   Bronchiectasis    ASSESSMENT and PLAN     Ms. Thelander is a 72 y.o. female with history of chronic rhinosinusitis, MS and GERD whom we are seeing for routine follow up of bronchiectasis.     Bronchiectasis with bilateral Infiltrates, tree-in-bud nodules   No clear etiology of bronchiectasis: no hypogammaglobulinemia, normal IgE, HIV negative, CF testing normal, alpha one testing normal    Course of Disease:  - CT 01/2019  R>L bronchiectasis, more prominent in bilateral upper lobes and lingula/RML. Additionally, had bilateral solid and mixed nodules with some tree-in-bud.  - CT completed 08/2019 demonstrated overall  improvement in opacities, as well as improvement in FEV1 by spirometry.   - Based on 01/05/2020 PFTs and fevers, bronchoscopy was performed which showed PSA, penicillium, and actinomyces. Decision to treat with levaquin for 14 days and inhaled tobramycin for 28 days however patient continued to have worsening sx and PFTs. Patient admitted, IV ceftaz for 10 days, completed 30 day course of tobramycin  - CT 04/07/20 overall improved/stable and PFTs 02/26/20 improved after hospitalization. Completed trial of prednisone course due to peripheral eos, felt improved but did have poor sleep and increased appetite. Discontinued.   - 06/01/21 Patient sputum culture with 4+ MRSA and AFB with M absesscus for which CT was obtained. No cavitary lesion at that time and has not been able to produce sputum since so have not treated for M abscessus. Treated with doxycycline with improvement. Repeated tx 12/2021 and 07/2022 after returning from a cruise with fevers and increased sputum.   - Induced sputum 08/02/22 with HTS and albuterol in clinic > not able to produce a specimen; treated for exacerbation with doxycycline with improvement in symptoms    Today patient is not having a frequent exacerbation but does notice some junk in her chest. Not producing sputum (rarely does) but has not used airway clearance for the past month or two. Will start with resumption of airway clearance. If pt has an exacerbation and is not able to produce sputum, would consider repeat bronchoscopy, particularly given growth of M.abscessus on prior AFB (05/2021).    Treatment Plan:  - Will treat today with course of doxycycline for likely bronchiectasis exacerbation  - PFTs stable from prior  - Repeat CT chest to evaluate for any progression of bronchiectasis that would warrant further wu such as bronchoscopy  - Renewed standing AFB and lower respiratory culture order  - Continue albuterol with 3% HTS with aerobika in-line TID    -  7% caused oropharyngeal irritation, pre-med with albuterol.   - Recommended foam wedge for upright positioning while asleep  - Recommended regular exercise regimen (30 min 3x/wk)  - She uses saline rinse and budesonide rinses for her sinus disease   - Due to her having grown M abscessus and exacerbation frequency is only about one a year, will not put azithromycin     Health Maintenance  Covid-19: S/p J&J 4/6/2, Defers any future COVID vaccines  Influenza: UTD for 2024 season  Prevnar: s/p 12/02/2018, 12/02/2019  Tdap: UTD 12/02/2013  Lung cancer screening: Not a candidate    Immunization History   Administered Date(s) Administered    COVID-19 VACC,(JANSSEN)(PF) 05/27/2019    INFLUENZA IIV3 HIGH DOSE 37YRS+(FLUZONE) 01/09/2023    INFLUENZA INJ MDCK PF, QUAD,(FLUCELVAX)(3MO AND UP EGG FREE)  12/27/2015, 01/03/2018, 01/27/2019    INFLUENZA QUAD ADJUVANTED 16YR UP(FLUAD) 02/02/2022    INFLUENZA TIV (TRI) 68MO+ W/ PRESERV (IM) 12/02/2013, 12/08/2014    Influenza Vaccine Quad(IM)6 MO-Adult(PF) 12/28/2020    Influenza Virus Vaccine, unspecified formulation 12/03/2012    Influenza,MDCK, QUAD w/preservative(Flucelvax) 01/02/2017    PNEUMOCOCCAL POLYSACCHARIDE 23-VALENT 12/19/2019 Pneumococcal Conjugate 13-Valent 12/02/2018, 12/02/2019    TdaP 12/02/2013     This patient was seen and discussed with attending physician Dr. Doralee Albino who agrees with the assessment and plan above.    CC: Gauger, Hermenia Fiscal, FNP    HISTORY:     History of Present Illness (02/12/2019):  Ms. Rattan is a 72 y.o. female with history of chronic rhinosinusitis, MS and GERD whom we are seeing in consultation for evaluation of abnormal CT scan and cough. Ms. Robitaille reports she has had a cough for about the past year. Cough seems to wax and wane, although has become more persistent and irritating. Cough does seem to be worse with seasonal changes / allergy flares. Not worsened with PO intake and no concern for aspiration. Has minimal GERD symptoms. Cough is often dry, but notes intermittently and more recently, she is able to cough up sputum with is thick yellow/green, sticky and foul-smelling. She notes she has chronic rhinosinusitis with obvious post-nasal drip; her therapy has recently been stepped up to steroid rinses for this. She does query if some of what she coughs up is this drainage. She reports she'd have mild-to-moderate SOB with heavier exertion, although no SOB with routine activities. She denies fevers, chills, sweats.    She notes given persistence and worsening of cough, a CXR was obtained which was abnormal, and led to a CT scan completed earlier this month which demonstrated multiple pulmonary nodules (see objective section below for interpretation). She was referred to [place] for further evaluation.     She does not smoke, but notes she has had significant second hand smoke exposure taking care of her mother over the years who was a heavy smoker. No inhaled substances. No known occupational exposures. Denies significant childhood illnesses, although notes for past several years, as above, has had recurrent sinus infections for which she follows with ENT. Notes she infrequently has chest colds, but has never had a severe respiratory illness. No known bronchiectasis in family. She has had multiple children. Family history notable for asthma and acute eosinophilic pneumonia of unclear etiology in her sister. Her mother had COPD associated with significant smoking. No family history of lung cancer, although this is a big concern for Ms. Howland given her second hand smoke exposure. No known history of rheumatologic diseases. She has MS, which she states has been slowly progressive, and she does not require any directed therapy for this (has been on no immunosuppression).    Interval Update 10/18/20  Since her last visit in clinic Ms. Reesor completed one course of levaquin for cough and congestion. She was also treated for COVID with paxlovid with only mild sx. She has otherwise been doing well. She received a percussive vest from [employer] and has been using this in place of her nebulizer which she much prefers. Still not producing much mucus. No fevers. Able to sing in chorus.     Interval Update 04/04/21  Ms. Drozda has been feeling great. She does huff cough daily, she also has a nebulizer and will do albuterol and 3% saline via [place] if she has a cold or notices more sputum. Denies any other complaints. No sputum  production and stable cough. Able to sing in choir.     Interval Update 09/26/2021   She has been using her albuterol and HTS saline twice a day with her [place]. When she does cough up sputum it is no longer foul smelling. She brought a sample 7/17 after she was around a sick grandchild but it seems to have been lost. Her sputum has returned to normal but she is having a very raw sore feeling in her chest.     01/02/22  Started having cough with scant light green sputum production one week ago. Has also been having nightly fevers. Last night was her first night without a fever and she feels slightly better than early last week. She was recently around family who developed resp sx several days ago. COVID NEG x2 at home. She does not feel that she can produce sputum today.    04/10/21  Ms. Toal has had some congestion that feels more like it is getting stuck in her throat. No chest congestion or fullness. No wheezing or shortness of breath. Still singing.     08/02/22  Patient returned from [place] with increased cough with sputum production and fever. Prescribed 7 day course of doxycycline 6/6. She initially presented to urgent care with a sinus infection. She has increased her airway clearance to three times a day. She is getting sputum with airway clearance that has a slight foul order. Treated with doxycycline with good effect.    10/25/22  Has not been using airway clearance due to helping care for father-in-law who was recently hospitalized. Normally does at least twice a day. Admits its a hard discipline when you're feeling well. She has not had any exacerbations since the last visit but does note that this week she's felt a bit more junky with increased chest congestion. Continues to use sinus rinses.     01/23/23  Describes about 6 weeks of congestion. Cough with chest tightness. Increased gunk but can't cough up despite albuterol+HTS3%+Aerobika (1-2 x day). Slight dyspnea going up at flight of steps which differs from baseline. When asked if similar to prior infections, she says yes with key exception that she doesn't have the taste in her mouth that is classic for developing infection.    PMH: Chronic rhinosinusitis / allergic rhinitis  Hypothyroidism  Migraines  Multiple sclerosis -- slowly progressive, diagnosed 30 yrs ago, on no medications, no recent exacerbations  Mitral valve Prolapse  GERD, occasional     SurgHx: Appendectomy  Hysterectomy  Tubal ligation     FH: Father (deceased): Alzheimer's Disease, EtOH abuse, stroke  Mother: COPD (assocated with smoking), Migraines, glaucoma, skin cancer  Sister: Asthma, eosinophilic pneumonia, pan-sinusitis  Maternal aunt: Ovarian cancer, throat cancer, pancreatic cancer  MGM: Ovarian Cancer, DM     SocHx: Tobacco: Never, although significant second hand exposure  EtOH: Denies  Drugs: Denies  Living situation: Lives in [place], [place]os[place], home treated for mold growth underneath house in crawl space a couple of years ago  Occupational: More recently office clerical work, 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 Pertinent pulmonary meds:  Budesonide - nasal/sinus rinses     Allergies: Allergies   Allergen Reactions    Keflex [Cephalexin] Hives    Erythromycin Nausea Only    Sulfa (Sulfonamide Antibiotics) Rash      ROS: 12-point ROS reviewed with patient with pertinent positives and negatives as per HPI.  PHYSICAL EXAM:     Vitals:    01/24/23 0934   BP: 133/73   Pulse: 55   SpO2: 97%       General: No acute distress   Eyes: EOMI, sclera anicteric  CV: RRR, no m/r/g  Lungs: CTAB, perhaps very minorly decreased lung sound on R compared to L, scant L wheeze in LUL  Abd: ND  Ext: no clubbing, no cyanosis  Skin: No rashes  Neuro: No focal deficits, alert & oriented    LABORATORY and RADIOLOGY DATA:     PFTs        Pulmonary Function Test Results  Date FVC FEV1 FEV1/FVC TLC DLCO Other   01/24/23 1.89 (-1.30,77%) 1.33 (-1.68, 69%) 70 - 12.43          Pertinent Laboratory Data:  01/19/20 (bronchoscopy) 300k CFU PsA, AFB Penicillum species, 1+ Actinomyces (not israelii)  06/13/21 OPF   06/01/21 4+ MSSA, M.abscessus  08/2021 brought in a sample but it was not run for an unclear reason     Pertinent Imaging Data:  CT 04/2019:  IMPRESSION:   Interval improvement of patchy and nodular pulmonary parenchymal opacities with residual nodular opacities. Lingular bronchiectasis. Findings are most likely representing endobronchial infection such as MAI. Recommend 3-6 month follow-up chest CT for further evaluation.    CT 08/2019  IMPRESSION:  Persistent bronchiectasis, small airway inflammation and associated peripheral respiratory bronchiolitis, with mild increase in the bronchiolitis in the peripheral right lower lobe. 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    CT 12/2019  IMPRESSION:   Mild lingular bronchiectasis, and clustered nodular pulmonary parenchymal opacities most likely representing indolent small airway infection. Overall the appearance is grossly unchanged from prior studies.    CT 03/2020  IMPRESSION:  Unchanged bronchiectasis within the lingula and medial right middle lobe as well as multiple clusters of centrilobular pulmonary nodules, which can be seen in the setting of indolent small airway infection.    CT 06/2021  IMPRESSION:  Redemonstrated mild bronchiectasis within the lingula and right middle lobe as multifocal areas of clustered and branching nodularity, some of which are new compared to prior. 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 Correlate sputum analysis as indicated.     2v CXR 07/2022  IMPRESSION:  Two small nodular density seen in lateral portion of left mid lung   which were present on CT scan of 2020 and most consistent with post   infectious scarring. Right perihilar and apical scarring is noted as   well. No definite acute abnormality is noted.

## 2023-01-24 NOTE — Unmapped (Addendum)
It was a pleasure seeing you in clinic today! You were seen in clinic today by Dr. Janee Morn & Dr. Colon Branch    Here's a summary of what we discussed during your visit:  Your pulmonary functions are stable compared to 07/2022  We will treat your doxycycline   Will repeat CT scan. If you do not hear from a scheduler within a week, please call [facility] Radiology at [phone] to schedule your CT Chest.  Keep up the good work with doing airway clearance  If you ever produce sputum, please present to the nearest [facility] lab    Follow up with me in 4 months!    Benjaman Kindler, MD  Pulmonary & Critical Care Fellow  [facility]  Phone: [phone]  Fax: [phone]  ______________________________________________________________________________________________________________________________  To contact your care team, you can either send a message via MyChart or contact the clinic at [phone].    How do I request medication refills?  Request a refill via [facility] (patient portal), call clinic at [phone] or have your pharmacy fax the request to [phone].    [facility]: [phone] *Pharmacy can mail medications to your home. You must call to request the medication be mailed.[facility]: [phone]  [facility]: [phone]    Here are some things you should know about contacting the [facility]:  Please be advised Epic now releases test results to MyChart as soon as they are available which means you will see your test results before I do.    The best way to reach your doctor for non-urgent matters is through MyChart. I can usually respond within two to three business day but I do not check messages after hours (evenings, weekends, and holidays) and often have other duties (inpatient hospital work, Producer, television/film/video activities, teaching) that make me unavailable.    - If you have sent a MyChart message to the clinic and have not received a response after three business days, please call our clinic at [phone] to speak to a nurse.     If you have an urgent issue that you feel needs a response the same day, you should also contact your primary care provider or be evaluated at an Urgent Care clinic.    For urgent lung issues after hours, you can call the hospital operator [phone]) and ask for the Pulmonary Fellow On Call. This doctor can provide some guidance and will send a message to your regular lung doctor the next morning.    If there is an emergency, call 911 or go to your closest emergency room.    I don't have a MyChart. Why should I get one?   - It's encrypted, so your information is secure  - It's a quick, easy way to contact the care team, manage appointments, see test results, and more!    How do I sign-up for MyChart?   - Download the MyChart app from the [employer] or [employer] and sign-up in the app  - Sign-up online at [email or web address]

## 2023-01-30 ENCOUNTER — Ambulatory Visit: Admit: 2023-01-30 | Discharge: 2023-01-31 | Payer: MEDICARE

## 2023-02-04 NOTE — Unmapped (Signed)
I saw and evaluated the patient, participating in the key portions of the service.  I reviewed the resident???s note.  I agree with the resident???s findings and plan.        Montine Circle, MD

## 2023-05-15 ENCOUNTER — Other Ambulatory Visit: Payer: Self-pay | Admitting: Internal Medicine

## 2023-05-15 DIAGNOSIS — Z1231 Encounter for screening mammogram for malignant neoplasm of breast: Secondary | ICD-10-CM

## 2023-06-12 NOTE — Unmapped (Signed)
 Pulmonary Clinic - Return Visit    Referring Physician :  Thornell Flirt, MD  PCP:     Thornell Flirt, MD  Reason for Consult:   Bronchiectasis    ASSESSMENT and PLAN     Victoria Copeland is a 73 y.o. female with history of chronic rhinosinusitis, MS and GERD whom we are seeing for routine follow up of bronchiectasis.     Bronchiectasis with bilateral Infiltrates, tree-in-bud nodules   No clear etiology of bronchiectasis: no hypogammaglobulinemia, normal IgE, HIV negative, CF testing normal, alpha one testing normal    Course of Disease:  - CT 01/2019  R>L bronchiectasis, more prominent in bilateral upper lobes and lingula/RML. Additionally, had bilateral solid and mixed nodules with some tree-in-bud.  - CT completed 08/2019 demonstrated overall  improvement in opacities, as well as improvement in FEV1 by spirometry.   - Based on 01/05/2020 PFTs and fevers, bronchoscopy was performed which showed PSA, penicillium, and actinomyces. Decision to treat with levaquin  for 14 days and inhaled tobramycin  for 28 days however patient continued to have worsening sx and PFTs. Patient admitted, IV ceftaz for 10 days, completed 30 day course of tobramycin   - CT 04/07/20 overall improved/stable and PFTs 02/26/20 improved after hospitalization. Completed trial of prednisone  course due to peripheral eos, felt improved but did have poor sleep and increased appetite. Discontinued.   - 06/01/21 Patient sputum culture with 4+ MRSA and AFB with M absesscus for which CT was obtained. No cavitary lesion at that time and has not been able to produce sputum since so have not treated for M abscessus. Treated with doxycycline  with improvement. Repeated tx 12/2021 and 07/2022 after returning from a cruise with fevers and increased sputum.   - Induced sputum 08/02/22 with HTS and albuterol  in clinic > not able to produce a specimen; treated for exacerbation with doxycycline  with improvement in symptoms  - Early signs of flare (chest congestion, worsened dyspnea) at 01/2023; treated empirically with doxy given prior MRSA growth on cultures. Sx resolved with this intervention. Repeat imaging stable at that time.    Today patient has no pulmonary complaints, so we will not make any changes at this time.    Treatment Plan:  - PFTs stable from prior; will repeat prior to next visit  - Most recent CT Chest imaging stable from prior  - Renewed standing AFB and lower respiratory culture order  - Continue albuterol  with 3% HTS with aerobika in-line TID   - currently using once a day but increases PRN   -  7% caused oropharyngeal irritation, pre-med with albuterol .   - Recommended foam wedge for upright positioning while asleep  - Recommended regular exercise regimen (30 min 3x/wk)  - Rx'd sinus and budesonide  rinses for her sinus disease   - Due to her having grown M abscessus and exacerbation frequency is only about one a year, will not put azithromycin   - If pt needs doxycyline, requests 50 mg tablets rather than 100 mg tablets as they are difficult to swallow    Health Maintenance  Covid-19: S/p J&J 4/6/2, Defers any future COVID vaccines  Influenza: UTD for 2024 season  Prevnar: s/p 12/02/2018, 12/02/2019  Tdap: UTD 12/02/2013  Lung cancer screening: Not a candidate    Immunization History   Administered Date(s) Administered    COVID-19 VACC,(JANSSEN)(PF) 05/27/2019    INFLUENZA IIV3 HIGH DOSE 42YRS+(FLUZONE) 01/09/2023    INFLUENZA INJ MDCK PF, QUAD,(FLUCELVAX)(73MO AND UP EGG FREE) 12/27/2015, 01/03/2018, 01/27/2019  INFLUENZA QUAD ADJUVANTED 28YR UP(FLUAD) 02/02/2022    INFLUENZA TIV (TRI) 32MO+ W/ PRESERV (IM) 12/02/2013, 12/08/2014    Influenza Vaccine Quad(IM)6 MO-Adult(PF) 12/28/2020    Influenza Virus Vaccine, unspecified formulation 12/03/2012    Influenza,MDCK, QUAD w/preservative(Flucelvax) 01/02/2017    PNEUMOCOCCAL POLYSACCHARIDE 23-VALENT 12/19/2019    Pneumococcal Conjugate 13-Valent 12/02/2018, 12/02/2019    TdaP 12/02/2013     This patient was seen and discussed with attending physician Dr. Fernand Howard who agrees with the assessment and plan above.    CC: Thornell Flirt, MD    HISTORY:     History of Present Illness (02/12/2019):  Victoria Copeland is a 73 y.o. female with history of chronic rhinosinusitis, MS and GERD whom we are seeing in consultation for evaluation of abnormal CT scan and cough. Victoria Copeland reports she has had a cough for about the past year. Cough seems to wax and wane, although has become more persistent and irritating. Cough does seem to be worse with seasonal changes / allergy flares. Not worsened with PO intake and no concern for aspiration. Has minimal GERD symptoms. Cough is often dry, but notes intermittently and more recently, she is able to cough up sputum with is thick yellow/green, sticky and foul-smelling. She notes she has chronic rhinosinusitis with obvious post-nasal drip; her therapy has recently been stepped up to steroid rinses for this. She does query if some of what she coughs up is this drainage. She reports she'd have mild-to-moderate SOB with heavier exertion, although no SOB with routine activities. She denies fevers, chills, sweats.    She notes given persistence and worsening of cough, a CXR was obtained which was abnormal, and led to a CT scan completed earlier this month which demonstrated multiple pulmonary nodules (see objective section below for interpretation). She was referred to us  for further evaluation.     She does not smoke but notes she has had significant second hand smoke exposure taking care of her mother over the years who was a heavy smoker. No inhaled substances. No known occupational exposures. Denies significant childhood illnesses, although notes for past several years, as above, has had recurrent sinus infections for which she follows with ENT. Notes she infrequently has chest colds, but has never had a severe respiratory illness. No known bronchiectasis in family. She has had multiple children. Family history notable for asthma and acute eosinophilic pneumonia of unclear etiology in her sister. Her mother had COPD associated with significant smoking. No family history of lung cancer, although this is a big concern for Victoria Copeland given her second hand smoke exposure. No known history of rheumatologic diseases. She has MS, which she states has been slowly progressive, and she does not require any directed therapy for this (has been on no immunosuppression).    Interval Update 10/18/20  Since her last visit in clinic Victoria Copeland completed one course of levaquin  for cough and congestion. She was also treated for COVID with paxlovid  with only mild sx. She has otherwise been doing well. She received a percussive vest from [employer] and has been using this in place of her nebulizer which she much prefers. Still not producing much mucus. No fevers. Able to sing in chorus.     Interval Update 04/04/21  Victoria Copeland has been feeling great. She does huff cough daily, she also has a nebulizer and will do albuterol  and 3% saline via aerobika if she has a cold or notices more sputum. Denies any other complaints. No sputum production and stable  cough. Able to sing in choir.     Interval Update 09/26/2021   She has been using her albuterol  and HTS saline twice a day with her aerobika. When she does cough up sputum it is no longer foul smelling. She brought a sample 7/17 after she was around a sick grandchild but it seems to have been lost. Her sputum has returned to normal but she is having a very raw sore feeling in her chest.     01/02/22  Started having cough with scant light green sputum production one week ago. Has also been having nightly fevers. Last night was her first night without a fever and she feels slightly better than early last week. She was recently around family who developed resp sx several days ago. COVID NEG x2 at home. She does not feel that she can produce sputum today.    04/10/21  Ms. Balbach has had some congestion that feels more like it is getting stuck in her throat. No chest congestion or fullness. No wheezing or shortness of breath. Still singing.     08/02/22  Patient returned from [place] with increased cough with sputum production and fever. Prescribed 7 day course of doxycycline  6/6. She initially presented to urgent care with a sinus infection. She has increased her airway clearance to three times a day. She is getting sputum with airway clearance that has a slight foul order. Treated with doxycycline  with good effect.    10/25/22  Has not been using airway clearance due to helping care for father-in-law who was recently hospitalized. Normally does at least twice a day. Admits its a hard discipline when you're feeling well. She has not had any exacerbations since the last visit but does note that this week she's felt a bit more junky with increased chest congestion. Continues to use sinus rinses.     01/23/23  Describes about 6 weeks of congestion. Cough with chest tightness. Increased gunk but can't cough up despite albuterol +HTS3%+Aerobika (1-2 x day). Slight dyspnea going up at flight of steps which differs from baseline. When asked if similar to prior infections, she says yes with key exception that she doesn't have the taste in her mouth that is classic for developing infection.    06/13/23  Patient stable from breathing standpoint. In February treated for bacterial sinusitis with doxycycline . Seeing dentist for TMJ. Also has headaches. Does have some yellow gunk coming up every once in a while. Thinks its moreso related to pollen. Doesn't think she has an active infection. No fever, no night sweats.    PMH: Chronic rhinosinusitis / allergic rhinitis  Hypothyroidism  Migraines  Multiple sclerosis -- slowly progressive, diagnosed 30 yrs ago, on no medications, no recent exacerbations  Mitral valve Prolapse  GERD, occasional     SurgHx: Appendectomy  Hysterectomy  Tubal ligation     FH: Father (deceased): Alzheimer's Disease, EtOH abuse, stroke  Mother: COPD (assocated with smoking), Migraines, glaucoma, skin cancer  Sister: Asthma, eosinophilic pneumonia, pan-sinusitis  Maternal aunt: Ovarian cancer, throat cancer, pancreatic cancer  MGM: Ovarian Cancer, DM     SocHx: Tobacco: Never, although significant second hand exposure  EtOH: Denies  Drugs: Denies  Living situation: Lives in [place], [place]osue built [place], home treated for mold growth underneath house in crawl space a couple of years ago  Occupational: More recently office clerical work, 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 Pertinent pulmonary meds:  Budesonide  - nasal/sinus rinses     Allergies: Allergies   Allergen Reactions    Keflex  [Cephalexin ] Hives    Erythromycin Nausea Only    Sulfa (Sulfonamide Antibiotics) Rash      ROS: 12-point ROS reviewed with patient with pertinent positives and negatives as per HPI.       PHYSICAL EXAM:     Vitals:    06/13/23 1131   BP: 117/79   Pulse: 59   Temp: 36.2 ??C (97.1 ??F)     General: No acute distress   Eyes: EOMI, sclera anicteric  CV: RRR, no m/r/g  Lungs: CTAB, perhaps very minorly decreased lung sound on R compared to L, scant L wheeze in LUL  Abd: ND  Ext: no clubbing, no cyanosis  Skin: No rashes  Neuro: No focal deficits, alert & oriented    LABORATORY and RADIOLOGY DATA:     PFTs        Pulmonary Function Test Results  Date FVC FEV1 FEV1/FVC TLC DLCO Other   01/24/23 1.89 (-1.30,77%) 1.33 (-1.68, 69%) 70 - 12.43     08/02/22 1.90 (72%) 1.31 (64%) 69 - -        Pertinent Laboratory Data:  01/19/20 (bronchoscopy) 300k CFU PsA, AFB Penicillum species, 1+ Actinomyces (not israelii)  06/13/21 OPF   06/01/21 4+ MSSA, M.abscessus  08/2021 brought in a sample but it was not run for an unclear reason     Pertinent Imaging Data:  CT 04/2019:  IMPRESSION:   Interval improvement of patchy and nodular pulmonary parenchymal opacities with residual nodular opacities. Lingular bronchiectasis. Findings are most likely representing endobronchial infection such as MAI. Recommend 3-6 month follow-up chest CT for further evaluation.    CT 08/2019  IMPRESSION:  Persistent bronchiectasis, small airway inflammation and associated peripheral respiratory bronchiolitis, with mild increase in the bronchiolitis in the peripheral right lower lobe. 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    CT 12/2019  IMPRESSION:   Mild lingular bronchiectasis, and clustered nodular pulmonary parenchymal opacities most likely representing indolent small airway infection. Overall the appearance is grossly unchanged from prior studies.    CT 03/2020  IMPRESSION:  Unchanged bronchiectasis within the lingula and medial right middle lobe as well as multiple clusters of centrilobular pulmonary nodules, which can be seen in the setting of indolent small airway infection.    CT 06/2021  IMPRESSION:  Redemonstrated mild bronchiectasis within the lingula and right middle lobe as multifocal areas of clustered and branching nodularity, some of which are new compared to prior. 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 Correlate sputum analysis as indicated.     2v CXR 07/2022  IMPRESSION:  Two small nodular density seen in lateral portion of left mid lung   which were present on CT scan of 2020 and most consistent with post   infectious scarring. Right perihilar and apical scarring is noted as   well. No definite acute abnormality is noted.     CT Chest w/o Contrast 01/2023  *  Unchanged findings suggestive of chronic endobronchial infection, including nontuberculous mycobacterial etiologies like Mycobacterium avium complex. Component of chronic aspiration could also be present.  *  Debris in the thoracic esophagus, suggesting delayed esophageal clearance versus reflux.

## 2023-06-13 ENCOUNTER — Ambulatory Visit: Admit: 2023-06-13 | Discharge: 2023-06-14 | Payer: MEDICARE

## 2023-06-13 DIAGNOSIS — J479 Bronchiectasis, uncomplicated: Principal | ICD-10-CM

## 2023-06-13 NOTE — Unmapped (Signed)
 It was a pleasure seeing you in clinic today! You were seen in clinic today by Dr. Hildy Lowers & Dr. Tura Gaines    Here's a summary of what we discussed during your visit:  Plan for fu in 6 months. Will repeat breathing tests at that time.  I have renewed your standing sputum lab orders.  Please call with any increased sputum production or fevers.    Follow up with me in 6 months!    Woodward Head, MD  Pulmonary & Critical Care Fellow  [facility]  Phone: [phone]  Fax: [phone]  ______________________________________________________________________________________________________________________________  To contact your care team, you can either send a message via MyChart or contact the clinic at [phone].    How do I request medication refills?  Request a refill via [facility] (patient portal), call clinic at [phone] or have your pharmacy fax the request to [phone].    [facility]: [phone] *Pharmacy can mail medications to your home. You must call to request the medication be mailed.[facility]: [phone]  [facility]: [phone]    Here are some things you should know about contacting the [facility]:  Please be advised Epic now releases test results to MyChart as soon as they are available which means you will see your test results before I do.    The best way to reach your doctor for non-urgent matters is through MyChart. I can usually respond within two to three business day but I do not check messages after hours (evenings, weekends, and holidays) and often have other duties (inpatient hospital work, Producer, television/film/video activities, teaching) that make me unavailable.    - If you have sent a MyChart message to the clinic and have not received a response after three business days, please call our clinic at [phone] to speak to a nurse.     If you have an urgent issue that you feel needs a response the same day, you should also contact your primary care provider or be evaluated at an Urgent Care clinic.    For urgent lung issues after hours, you can call the hospital operator [phone]) and ask for the Pulmonary Fellow On Call. This doctor can provide some guidance and will send a message to your regular lung doctor the next morning.    If there is an emergency, call 911 or go to your closest emergency room.    I don't have a MyChart. Why should I get one?   - It's encrypted, so your information is secure  - It's a quick, easy way to contact the care team, manage appointments, see test results, and more!    How do I sign-up for MyChart?   - Download the MyChart app from the [employer] or [employer] and sign-up in the app  - Sign-up online at [email or web address]

## 2023-07-03 ENCOUNTER — Ambulatory Visit
Admission: RE | Admit: 2023-07-03 | Discharge: 2023-07-03 | Disposition: A | Discharge status: Home / Self Care | Source: Ambulatory Visit | Attending: Internal Medicine | Admitting: Internal Medicine

## 2023-07-03 DIAGNOSIS — Z1231 Encounter for screening mammogram for malignant neoplasm of breast: Secondary | ICD-10-CM | POA: Insufficient documentation

## 2023-08-21 ENCOUNTER — Ambulatory Visit
Admission: RE | Admit: 2023-08-21 | Discharge: 2023-08-21 | Attending: Emergency Medicine | Admitting: Emergency Medicine

## 2023-08-21 ENCOUNTER — Emergency Department

## 2023-08-21 ENCOUNTER — Emergency Department
Admission: EM | Admit: 2023-08-21 | Discharge: 2023-08-21 | Disposition: A | Discharge status: Home / Self Care | Source: Ambulatory Visit | Attending: Emergency Medicine | Admitting: Emergency Medicine

## 2023-08-21 ENCOUNTER — Other Ambulatory Visit: Payer: Self-pay

## 2023-08-21 ENCOUNTER — Encounter: Payer: Self-pay | Admitting: Emergency Medicine

## 2023-08-21 VITALS — BP 109/76 | HR 82 | Temp 98.6°F | Resp 16 | Ht 61.0 in | Wt 130.0 lb

## 2023-08-21 DIAGNOSIS — D72829 Elevated white blood cell count, unspecified: Secondary | ICD-10-CM | POA: Diagnosis not present

## 2023-08-21 DIAGNOSIS — R1032 Left lower quadrant pain: Secondary | ICD-10-CM

## 2023-08-21 DIAGNOSIS — K5792 Diverticulitis of intestine, part unspecified, without perforation or abscess without bleeding: Secondary | ICD-10-CM | POA: Insufficient documentation

## 2023-08-21 DIAGNOSIS — E039 Hypothyroidism, unspecified: Secondary | ICD-10-CM | POA: Insufficient documentation

## 2023-08-21 DIAGNOSIS — E871 Hypo-osmolality and hyponatremia: Secondary | ICD-10-CM | POA: Diagnosis not present

## 2023-08-21 LAB — COMPREHENSIVE METABOLIC PANEL WITH GFR
ALT: 20 U/L (ref 0–44)
AST: 28 U/L (ref 15–41)
Albumin: 3.9 g/dL (ref 3.5–5.0)
Alkaline Phosphatase: 69 U/L (ref 38–126)
Anion gap: 9 (ref 5–15)
BUN: 12 mg/dL (ref 8–23)
CO2: 24 mmol/L (ref 22–32)
Calcium: 9.1 mg/dL (ref 8.9–10.3)
Chloride: 100 mmol/L (ref 98–111)
Creatinine, Ser: 0.84 mg/dL (ref 0.44–1.00)
GFR, Estimated: >60 mL/min (ref >60)
Glucose, Bld: 124 mg/dL — ABNORMAL HIGH (ref 70–99)
Potassium: 3.5 mmol/L (ref 3.5–5.1)
Sodium: 133 mmol/L — ABNORMAL LOW (ref 135–145)
Total Bilirubin: 0.9 mg/dL (ref 0.0–1.2)
Total Protein: 8 g/dL (ref 6.5–8.1)

## 2023-08-21 LAB — URINALYSIS, ROUTINE W REFLEX MICROSCOPIC
Bilirubin Urine: NEGATIVE
Glucose, UA: NEGATIVE mg/dL
Ketones, ur: NEGATIVE mg/dL
Nitrite: NEGATIVE
Protein, ur: NEGATIVE mg/dL
Specific Gravity, Urine: 1.002 — ABNORMAL LOW (ref 1.005–1.030)
pH: 7 (ref 5.0–8.0)

## 2023-08-21 LAB — CBC
HCT: 37.3 % (ref 36.0–46.0)
Hemoglobin: 12.2 g/dL (ref 12.0–15.0)
MCH: 27.9 pg (ref 26.0–34.0)
MCHC: 32.7 g/dL (ref 30.0–36.0)
MCV: 85.2 fL (ref 80.0–100.0)
Platelets: 220 10*3/uL (ref 150–400)
RBC: 4.38 MIL/uL (ref 3.87–5.11)
RDW: 13.7 % (ref 11.5–15.5)
WBC: 11.3 10*3/uL — ABNORMAL HIGH (ref 4.0–10.5)
nRBC: 0 % (ref 0.0–0.2)

## 2023-08-21 LAB — LIPASE, BLOOD: Lipase: 32 U/L (ref 11–51)

## 2023-08-21 MED ORDER — IOHEXOL 300 MG/ML  SOLN
100.0000 mL | Freq: Once | INTRAMUSCULAR | Status: AC | PRN
  Administered 2023-08-21: 100 mL via INTRAVENOUS

## 2023-08-21 MED ORDER — AMOXICILLIN-POT CLAVULANATE 875-125 MG PO TABS: 1.0000 | ORAL_TABLET | Freq: Two times a day (BID) | ORAL | 0 refills | Status: AC

## 2023-08-21 MED ORDER — AMOXICILLIN-POT CLAVULANATE 875-125 MG PO TABS
1.0000 | ORAL_TABLET | Freq: Once | ORAL | Status: AC
  Administered 2023-08-21: 1 via ORAL
  Filled 2023-08-21: qty 1

## 2023-08-21 MED ORDER — AMOXICILLIN-POT CLAVULANATE 875-125 MG PO TABS: 1.0000 | ORAL_TABLET | Freq: Two times a day (BID) | ORAL | 0 refills | Status: DC

## 2023-08-21 NOTE — Discharge Instructions (Signed)
 I believe that you have diverticulitis.  I am concerned that you could have a microperforation or early abscess forming with your fevers.  Unfortunately, we do not have all the tools available here to rule these out.  Please go immediately to the emergency department.  I will call them and let them know that you are on your way.  Do not have anything to eat or drink until your ER evaluation is complete.  Let them know if your pain changes or gets worse.

## 2023-08-21 NOTE — ED Notes (Signed)
 Patient is being discharged from the Urgent Care and sent to the Emergency Department via personal vehicle . Per Dr. Van, patient is in need of higher level of care due to abdominal pain. Patient is aware and verbalizes understanding of plan of care.  Vitals:   08/21/23 1322  BP: 109/76  Pulse: 82  Resp: 16  Temp: 98.6 F (37 C)  SpO2: 99%

## 2023-08-21 NOTE — ED Provider Notes (Signed)
 HPI  SUBJECTIVE:  Shannon Gilmore is a 73 y.o. female who presents with 2 days of crampy, intermittent sharp, severe left lower quadrant pain that radiates into her back, fevers Tmax 100.1 and bloating.  She reports a constant uneasiness in the left lower quadrant.  She reports distention last night and pain with movements, but this has improved.  She had a mucousy bowel movement this morning without any change in her pain.  No anorexia, nausea, vomiting, dysuria, urgency, frequency, cloudy odorous urine, hematuria, diarrhea, melena, hematochezia.  The car ride over here today was not painful.  She tried decreasing her p.o. intake without any improvement in her symptoms.  No alleviating factors.  No aggravating factors.  No antipyretic in the past 6 hours.  She had similar episode 4 weeks ago with fevers to 100.2, but it resolved on its own after several days.  She has a past medical history of hypothyroidism, MS, bronchiectasis status post appendectomy/lysis of adhesions, hysterectomy, palpitations, migraines, UTI, pyelonephritis..  She had a colonoscopy in 2021 that showed diverticulosis.  No history of nephrolithiasis, diverticulitis or other abdominal surgeries.  [facility]  Past Medical History:  Diagnosis Date   Cancer [facility])    skin ca   Dental bridge present permanent - top right   Hypothyroidism    Migraine headache    approx 1x/mo for 3 days   MS (multiple sclerosis) (HCC)    MVP (mitral valve prolapse)    mild, dx'd many yrs ago. no issues   Palpitations     Past Surgical History:  Procedure Laterality Date   ABDOMINAL HYSTERECTOMY     ABDOMINAL SURGERY     AIKEN OSTEOTOMY Left 11/15/2017   Procedure: KATRINA OSTEOTOMY;  Surgeon: Lilli Cough, DPM;  Location: [facility];  Service: Podiatry;  Laterality: Left;  mini monster paragon screws and staples lma with local   HALLUX VALGUS AUSTIN Left 11/15/2017   Procedure: HALLUX VALGUS AUSTIN;  Surgeon:  Lilli Cough, DPM;  Location: [facility];  Service: Podiatry;  Laterality: Left;    Family History  Problem Relation Age of Onset   Breast cancer Neg Hx     Social History   Tobacco Use   Smoking status: Never   Smokeless tobacco: Never  Vaping Use   Vaping status: Never Used  Substance Use Topics   Alcohol use: No   Drug use: Never    No current facility-administered medications for this encounter.  Current Outpatient Medications:    albuterol (PROVENTIL) (5 MG/ML) 0.5% nebulizer solution, Inhale into the lungs., Disp: , Rfl:    amitriptyline (ELAVIL) 10 MG tablet, Take 10 mg by mouth at bedtime., Disp: , Rfl:    aspirin 81 MG tablet, Take 81 mg by mouth daily., Disp: , Rfl:    benzonatate  (TESSALON ) 100 MG capsule, Take 2 capsules (200 mg total) by mouth every 8 (eight) hours., Disp: 21 capsule, Rfl: 0   Calcium-Vitamin D-Vitamin K (VIACTIV PO), Take by mouth daily., Disp: , Rfl:    Cholecalciferol (VITAMIN D PO), Take by mouth daily., Disp: , Rfl:    conjugated estrogens (PREMARIN) vaginal cream, Place 1 Applicatorful vaginally daily., Disp: , Rfl:    dexamethasone  0.5 MG/5ML elixir, Take 5 mLs by mouth daily., Disp: , Rfl:    ipratropium (ATROVENT ) 0.06 % nasal spray, Place 2 sprays into both nostrils 4 (four) times daily., Disp: 15 mL, Rfl: 12   levothyroxine (SYNTHROID, LEVOTHROID) 50 MCG tablet, Take 50 mcg by mouth  daily before breakfast., Disp: , Rfl:    Multiple Vitamins-Minerals (CENTRUM SILVER PO), Take 0.5 tablets by mouth daily., Disp: , Rfl:    oxyCODONE -acetaminophen  (PERCOCET) 7.5-325 MG tablet, Take 1 tablet by mouth every 4 (four) hours as needed for severe pain., Disp: 30 tablet, Rfl: 0   promethazine -dextromethorphan (PROMETHAZINE -DM) 6.25-15 MG/5ML syrup, Take 5 mLs by mouth 4 (four) times daily as needed., Disp: 118 mL, Rfl: 0   propranolol ER (INDERAL LA) 80 MG 24 hr capsule, Take 80 mg by mouth daily., Disp: , Rfl:   Allergies  Allergen  Reactions   Cephalexin Hives   Cefdinir Hives   Cephalothin Rash   Erythromycin Nausea Only   Sulfa Antibiotics Rash     ROS  As noted in HPI.   Physical Exam  BP 109/76 (BP Location: Left Arm)   Pulse 82   Temp 98.6 F (37 C) (Oral)   Resp 16   Ht 5' 1 (1.549 m)   Wt 59 kg   SpO2 99%   BMI 24.56 kg/m   Constitutional: Well developed, well nourished, no acute distress Eyes: PERRL, EOMI, conjunctiva normal bilaterally HENT: Normocephalic, atraumatic,mucus membranes moist Respiratory: Normal respiratory effort Cardiovascular: Normal rate GI: Soft, nondistended, normal bowel sounds, left lower quadrant tenderness with mild rebound, no guarding.  Negative tap table test. Back: no CVAT skin: No rash, skin intact Musculoskeletal: No edema, no tenderness, no deformities Neurologic: Alert & oriented x 3, CN III-XII grossly intact, no motor deficits, sensation grossly intact Psychiatric: Speech and behavior appropriate   ED Course   Medications - No data to display  No orders of the defined types were placed in this encounter.  No results found for this or any previous visit (from the past 24 hours). No results found.  ED Clinical Impression  1. Left lower quadrant abdominal pain      ED Assessment/Plan     Presentation consistent with diverticulitis, but I am concerned that with the fevers, she could have a microperforation or early abscess forming.  Also the differential is UTI, early pyelonephritis, nephrolithiasis.  Doubt obstruction, mesenteric ischemia.  Unfortunately, we do not have advanced imaging available here today.  Transferring to the emergency department to rule out complicated diverticulitis, perforation, abscess formation.  She is stable to go by private vehicle.  She agrees to go there now.  Advised her be n.p.o. until ER evaluation is complete.  Gave report to [facility] charge nurse.  No orders of the defined types were placed in this  encounter.     *This clinic note was created using Dragon dictation software. Therefore, there may be occasional mistakes despite careful proofreading. ?    Van Knee, MD 08/21/23 1432

## 2023-08-21 NOTE — ED Triage Notes (Signed)
 Pt via POV from [facility]. Reports LLQ abd pain and fever that started Sunday night. Denies any NV. Denies any urinary symptoms. Pt is A&OX4 and NAD

## 2023-08-21 NOTE — ED Triage Notes (Signed)
 Pt c/o LLQ abd pain & fever x4 days. Tmax 100.1 last night. States Fever along with lower left abdominal pain bloating. Suspect diverticulosis or diverticulitis - Entered by patient

## 2023-08-21 NOTE — ED Provider Notes (Signed)
 [facility] Provider Note    Event Date/Time   First MD Initiated Contact with Patient 08/21/23 1735     (approximate)   History   Chief Complaint Abdominal Pain   HPI  Shannon Gilmore is a 73 y.o. female with past medical history of multiple sclerosis, migraines, and hypothyroidism who presents to the ED complaining of abdominal pain.  Patient reports that she has had about 24 hours of increasing pain across both sides of her lower abdomen.  She describes the pain as crampy and it seems to affect the left lower portion of her abdomen more than the right.  She has had some urinary urgency but denies any dysuria, fever, or flank pain.  She has not had any nausea or vomiting and denies any changes in her bowel movements.     Physical Exam   Triage Vital Signs: ED Triage Vitals  Encounter Vitals Group     BP 08/21/23 1523 136/77     Girls Systolic BP Percentile --      Girls Diastolic BP Percentile --      Boys Systolic BP Percentile --      Boys Diastolic BP Percentile --      Pulse Rate 08/21/23 1523 74     Resp 08/21/23 1523 18     Temp 08/21/23 1523 98.5 F (36.9 C)     Temp Source 08/21/23 1523 Oral     SpO2 08/21/23 1523 98 %     Weight 08/21/23 1522 130 lb (59 kg)     Height 08/21/23 1522 5' 2 (1.575 m)     Head Circumference --      Peak Flow --      Pain Score 08/21/23 1522 2     Pain Loc --      Pain Education --      Exclude from Growth Chart --     Most recent vital signs: Vitals:   08/21/23 1523 08/21/23 1735  BP: 136/77 131/75  Pulse: 74 66  Resp: 18 17  Temp: 98.5 F (36.9 C)   SpO2: 98% 99%    Constitutional: Alert and oriented. Eyes: Conjunctivae are normal. Head: Atraumatic. Nose: No congestion/rhinnorhea. Mouth/Throat: Mucous membranes are moist.  Cardiovascular: Normal rate, regular rhythm. Grossly normal heart sounds.  2+ radial pulses bilaterally. Respiratory: Normal respiratory effort.  No retractions.  Lungs CTAB. Gastrointestinal: Soft and tender to palpation in the left lower quadrant with no rebound or guarding.  No distention. Musculoskeletal: No lower extremity tenderness nor edema.  Neurologic:  Normal speech and language. No gross focal neurologic deficits are appreciated.    ED Results / Procedures / Treatments   Labs (all labs ordered are listed, but only abnormal results are displayed) Labs Reviewed  COMPREHENSIVE METABOLIC PANEL WITH GFR - Abnormal; Notable for the following components:      Result Value   Sodium 133 (*)    Glucose, Bld 124 (*)    All other components within normal limits  CBC - Abnormal; Notable for the following components:   WBC 11.3 (*)    All other components within normal limits  URINALYSIS, ROUTINE W REFLEX MICROSCOPIC - Abnormal; Notable for the following components:   Color, Urine STRAW (*)    APPearance CLEAR (*)    Specific Gravity, Urine 1.002 (*)    Hgb urine dipstick SMALL (*)    Leukocytes,Ua TRACE (*)    Bacteria, UA RARE (*)    All other components within  normal limits  LIPASE, BLOOD    RADIOLOGY CT abdomen/pelvis reviewed and interpreted by me with inflammatory changes in the left lower quadrant consistent with diverticulitis.  PROCEDURES:  Critical Care performed: No  Procedures   MEDICATIONS ORDERED IN ED: Medications  iohexol (OMNIPAQUE) 300 MG/ML solution 100 mL (100 mLs Intravenous Contrast Given 08/21/23 1825)  amoxicillin-clavulanate (AUGMENTIN) 875-125 MG per tablet 1 tablet (1 tablet Oral Given 08/21/23 1926)     IMPRESSION / MDM / ASSESSMENT AND PLAN / ED COURSE  I reviewed the triage vital signs and the nursing notes.                              73 y.o. female with past medical history of multiple sclerosis, migraines, and hypothyroidism who presents to the ED with 24 hours of increasing lower abdominal pain, left greater than right. Patient's presentation is most consistent with acute presentation with  potential threat to life or bodily function.  Differential diagnosis includes, but is not limited to, diverticulitis, abscess, bowel obstruction, kidney stone, UTI, colitis.  Patient nontoxic-appearing and in no acute distress, vital signs are unremarkable.  Her abdomen is soft but she does have tenderness to palpation of the left lower quadrant, will further assess with CT imaging.  Labs without significant anemia, leukocytosis, electrolyte abnormality, or AKI.  LFTs and lipase are unremarkable, urinalysis does not appear concerning for infection.  Patient declines pain or nausea medication, will reassess following imaging.  CT imaging consistent with an uncomplicated diverticulitis.  Pain remains well-controlled on reassessment and patient appropriate for outpatient management, was given initial dose of Augmentin here in the ED.  She was counseled to return to the ED for new or worsening symptoms, patient agrees with plan.      FINAL CLINICAL IMPRESSION(S) / ED DIAGNOSES   Final diagnoses:  Diverticulitis     Rx / DC Orders   ED Discharge Orders          Ordered    amoxicillin-clavulanate (AUGMENTIN) 875-125 MG tablet  2 times daily        08/21/23 1937             Note:  This document was prepared using Dragon voice recognition software and may include unintentional dictation errors.   Willo Dunnings, MD 08/21/23 [phone]

## 2024-02-27 ENCOUNTER — Inpatient Hospital Stay: Admit: 2024-02-27 | Discharge: 2024-02-27 | Payer: MEDICARE

## 2024-02-27 ENCOUNTER — Ambulatory Visit: Admit: 2024-02-27 | Discharge: 2024-02-27 | Payer: MEDICARE

## 2024-02-27 DIAGNOSIS — J479 Bronchiectasis, uncomplicated: Principal | ICD-10-CM

## 2024-02-27 DIAGNOSIS — J019 Acute sinusitis, unspecified: Principal | ICD-10-CM

## 2024-02-27 DIAGNOSIS — B9689 Other specified bacterial agents as the cause of diseases classified elsewhere: Principal | ICD-10-CM

## 2024-02-27 DIAGNOSIS — B999 Unspecified infectious disease: Principal | ICD-10-CM

## 2024-02-27 DIAGNOSIS — J471 Bronchiectasis with (acute) exacerbation: Principal | ICD-10-CM

## 2024-02-27 MED ORDER — SODIUM CHLORIDE 3 % FOR NEBULIZATION
Freq: Three times a day (TID) | RESPIRATORY_TRACT | 5 refills | 75.00000 days | Status: CP
Start: 2024-02-27 — End: ?
  Filled 2024-03-03: qty 240, 20d supply, fill #0

## 2024-02-27 MED ORDER — ALBUTEROL SULFATE CONCENTRATE 2.5 MG/0.5 ML SOLUTION FOR NEBULIZATION
Freq: Three times a day (TID) | RESPIRATORY_TRACT | 3 refills | 20.00000 days | Status: CP | PRN
Start: 2024-02-27 — End: 2024-05-27
  Filled 2024-03-03: qty 60, 20d supply, fill #0

## 2024-02-27 NOTE — Progress Notes (Signed)
 Pulmonary Clinic - Return Visit    Referring Physician :  Salli Doretta Large, MD  PCP:     Salli Doretta Large, MD  Reason for Consult:   Bronchiectasis    ASSESSMENT and PLAN     Victoria Copeland is a 74 y.o. female with history of chronic rhinosinusitis, MS and GERD whom we are seeing for routine follow up of bronchiectasis.     Patient doing well at last patient visit 06/13/23    Bronchiectasis with bilateral Infiltrates, tree-in-bud nodules   2020 CT: R>L bronchiectasis, more prominent in bilateral upper lobes and lingula/RML   Etiology:   No hypogammaglobulinemia, normal IgE, HIV negative, CF testing normal, alpha one testing normal  Give negative wu, suspect comorbid recurrent bacterial rhinosinusitis may be primary driver    Prior Infections/Culture Data:   05/2021 expectorated sputum: M.abscessus, 4+ MSSA (pansensi)  CT w/o cavitation, so did not treat for M.Abscessus  MSSA pansensitive, tx'd w/ doxy  01/19/20 BAL 300k CFU PsA, 1+ Actinomyces, Penicillum species (Not israelii)  Initial outpt tx w/ levaquin  x 14 d + inhaled tobra x28   Worsening despite PO abx & tobi  nebs -> hospitalized, txt w/ IV ceftaz, cont inhaled tobra x28d  Trial steroids for peripheral eos, ?benefit, AE insomnia and suppressed appetite  Antibiotics:  Avoid azithromycin  as able given prior m.Abscessus growth  If pt needs doxycyline, order as 50 mg tablets rather than 100 mg tablets as they are difficult to swallow  Airway Clearance: Albuterol  nebs->HTS 3% with in-line Aerobika  Minimal mucus production at baseline, last BAL 12/2019  Baseline use once a day but increases PRN  7% caused oropharyngeal irritation, pre-med with albuterol .   Flares over past year: x1  01/2024: PFTs stable, no recent CT, no cultures, empiric txt w/ doxy (PCP rx'd)  Hospitalizations: x1, 12/2019 for IV abx   Last PFT 02/27/23 - completed towards end of flare -> stable FEV1, decreased FEF 25-75%.  Repeat ordered for 6 mo fu  Last CT 01/2023 - stable from prior    01/2024-02/2024 Acute Exacerbation  Doing well overall, one recent flare with low grade fever and green mucus draining from nose as well as expectorated. Improvement with doxy. Persistent chest tightness likely from mucus. Encouraged continue airway clearance with option to increase frequency. Still productive (cs rare mucus even with AC at baseline). Stable FEV1, decreased FEF 25-75%. Left lung clearer than right.  - Continue doxycycline  for 10 days.  - Pt to let me know if symptoms do not resolve following completion of doxy  - Continue airway clearance with albuterol  nebulizer, hypertonic saline, and Aerobika.  - Renewed lab orders for AFB and lower respiratory culture.  - Encouraged regular exercise as tolerated.    Acute bacterial sinusitis  Improved symptoms with doxycycline . Likely bacterial due to mucus change and fever. Foll w/ ENT.  - Continue doxycycline  as prescribed.  - Has sinus and budesonide  rinses for her sinus disease     GERD  - Recommended foam wedge for upright positioning while asleep  - Defer medical mgmt to PCP    Health Maintenance  Covid-19: S/p J&J 4/6/2, Defers any future COVID vaccines  Influenza: UTD for 2024 season  Prevnar: s/p 12/02/2018, 12/02/2019  Tdap: UTD 12/02/2013  Lung cancer screening: Not a candidate    Immunization History   Administered Date(s) Administered    COVID-19 VACC,(JANSSEN)(PF) 05/27/2019    INFLUENZA IIV3 HIGH DOSE 17YRS+(FLUZONE) 01/09/2023    INFLUENZA INJ MDCK PF, QUAD,(FLUCELVAX)(26MO AND  UP EGG FREE) 12/27/2015, 01/03/2018, 01/27/2019    INFLUENZA QUAD ADJUVANTED 51YR UP(FLUAD) 02/02/2022    INFLUENZA TIV (TRI) 77MO+ W/ PRESERV (IM) 12/02/2013, 12/08/2014    Influenza Vaccine Quad(IM)6 MO-Adult(PF) 12/28/2020    Influenza Virus Vaccine, unspecified formulation 12/03/2012    Influenza,MDCK, QUAD w/preservative(Flucelvax) 01/02/2017    PNEUMOCOCCAL POLYSACCHARIDE 23-VALENT 12/19/2019    Pneumococcal Conjugate 13-Valent 12/02/2018, 12/02/2019    TdaP 12/02/2013     This patient was seen and discussed with attending physician Dr. Katie who agrees with the assessment and plan above.    CC: Salli Doretta Large, MD    HISTORY:     History of Present Illness (02/12/2019):  Victoria Copeland is a 74 y.o. female with history of chronic rhinosinusitis, MS and GERD whom we are seeing in consultation for evaluation of abnormal CT scan and cough. Victoria Copeland reports she has had a cough for about the past year. Cough seems to wax and wane, although has become more persistent and irritating. Cough does seem to be worse with seasonal changes / allergy flares. Not worsened with PO intake and no concern for aspiration. Has minimal GERD symptoms. Cough is often dry, but notes intermittently and more recently, she is able to cough up sputum with is thick yellow/green, sticky and foul-smelling. She notes she has chronic rhinosinusitis with obvious post-nasal drip; her therapy has recently been stepped up to steroid rinses for this. She does query if some of what she coughs up is this drainage. She reports she'd have mild-to-moderate SOB with heavier exertion, although no SOB with routine activities. She denies fevers, chills, sweats.    She notes given persistence and worsening of cough, a CXR was obtained which was abnormal, and led to a CT scan completed earlier this month which demonstrated multiple pulmonary nodules (see objective section below for interpretation). She was referred to us  for further evaluation.     She does not smoke but notes she has had significant second hand smoke exposure taking care of her mother over the years who was a heavy smoker. No inhaled substances. No known occupational exposures. Denies significant childhood illnesses, although notes for past several years, as above, has had recurrent sinus infections for which she follows with ENT. Notes she infrequently has chest colds, but has never had a severe respiratory illness. No known bronchiectasis in family. She has had multiple children. Family history notable for asthma and acute eosinophilic pneumonia of unclear etiology in her sister. Her mother had COPD associated with significant smoking. No family history of lung cancer, although this is a big concern for Victoria Copeland given her second hand smoke exposure. No known history of rheumatologic diseases. She has MS, which she states has been slowly progressive, and she does not require any directed therapy for this (has been on no immunosuppression).    Interval Update 10/18/20  Since her last visit in clinic Victoria Copeland completed one course of levaquin  for cough and congestion. She was also treated for COVID with paxlovid  with only mild sx. She has otherwise been doing well. She received a percussive vest from [employer] and has been using this in place of her nebulizer which she much prefers. Still not producing much mucus. No fevers. Able to sing in chorus.     Interval Update 04/04/21  Victoria Copeland has been feeling great. She does huff cough daily, she also has a nebulizer and will do albuterol  and 3% saline via aerobika if she has a cold or notices more sputum.  Denies any other complaints. No sputum production and stable cough. Able to sing in choir.     Interval Update 09/26/2021   She has been using her albuterol  and HTS saline twice a day with her aerobika. When she does cough up sputum it is no longer foul smelling. She brought a sample 7/17 after she was around a sick grandchild but it seems to have been lost. Her sputum has returned to normal but she is having a very raw sore feeling in her chest.     01/02/22  Started having cough with scant light green sputum production one week ago. Has also been having nightly fevers. Last night was her first night without a fever and she feels slightly better than early last week. She was recently around family who developed resp sx several days ago. COVID NEG x2 at home. She does not feel that she can produce sputum today.    04/10/21  Victoria Copeland has had some congestion that feels more like it is getting stuck in her throat. No chest congestion or fullness. No wheezing or shortness of breath. Still singing.     08/02/22  Patient returned from [place] with increased cough with sputum production and fever. Prescribed 7 day course of doxycycline  6/6. She initially presented to urgent care with a sinus infection. She has increased her airway clearance to three times a day. She is getting sputum with airway clearance that has a slight foul order. Treated with doxycycline  with good effect.    10/25/22  Has not been using airway clearance due to helping care for father-in-law who was recently hospitalized. Normally does at least twice a day. Admits its a hard discipline when you're feeling well. She has not had any exacerbations since the last visit but does note that this week she's felt a bit more junky with increased chest congestion. Continues to use sinus rinses.     01/23/23  Describes about 6 weeks of congestion. Cough with chest tightness. Increased gunk but can't cough up despite albuterol +HTS3%+Aerobika (1-2 x day). Slight dyspnea going up at flight of steps which differs from baseline. When asked if similar to prior infections, she says yes with key exception that she doesn't have the taste in her mouth that is classic for developing infection.    06/13/23  Patient stable from breathing standpoint. In February treated for bacterial sinusitis with doxycycline . Seeing dentist for TMJ. Also has headaches. Does have some yellow gunk coming up every once in a while. Thinks its moreso related to pollen. Doesn't think she has an active infection. No fever, no night sweats.  _____  RPV 02/27/23  - Uneventful summer, felt well.   - Low grade fever 12/25, soon after started producing green nasal discharge and coughing up the same. Suspects postnasal drip. Started on doxy 30 mg,  producing 4d later reached out to primary care provider. Doxy course 12/30-1/9, today day 7.  - Not quite back to baseline but improving, sinus and cough largely resolved  _______    PMH: Chronic rhinosinusitis / allergic rhinitis  Hypothyroidism  Migraines  Multiple sclerosis -- slowly progressive, diagnosed 30 yrs ago, on no medications, no recent exacerbations  Mitral valve Prolapse  GERD, occasional     SurgHx: Appendectomy  Hysterectomy  Tubal ligation     FH: Father (deceased): Alzheimer's Disease, EtOH abuse, stroke  Mother: COPD (assocated with smoking), Migraines, glaucoma, skin cancer  Sister: Asthma, eosinophilic pneumonia, pan-sinusitis  Maternal aunt: Ovarian cancer, throat cancer, pancreatic  cancer  MGM: Ovarian Cancer, DM     SocHx: Tobacco: Never, although significant second hand exposure  EtOH: Denies  Drugs: Denies  Living situation: Lives in [place], [place], home treated for mold growth underneath house in crawl space a couple of years ago  Occupational: More recently office clerical work, 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 Pertinent pulmonary meds:  Budesonide  - nasal/sinus rinses     Allergies: Allergies   Allergen Reactions    Keflex  [Cephalexin ] Hives    Doxycycline  Other (See Comments)     No allergic reaction to doxycycline , has difficulty swallowing 100 mg, able to swallow 50 mg tablets just fine.     Erythromycin Nausea Only    Sulfa (Sulfonamide Antibiotics) Rash      ROS: 12-point ROS reviewed with patient with pertinent positives and negatives as per HPI.       PHYSICAL EXAM:     Vitals:    02/27/24 1159   BP: 122/80   Pulse: 56   Temp: 35.8 ??C (96.5 ??F)   SpO2: 96%     Wt Readings from Last 6 Encounters:   02/27/24 58.1 kg (128 lb)   06/13/23 57.2 kg (126 lb)   01/24/23 57.2 kg (126 lb)   10/25/22 57.6 kg (127 lb)   08/02/22 58.1 kg (128 lb)   04/10/22 56.2 kg (124 lb)     General: No acute distress, well appearing  Eyes: EOMI, sclera anicteric  CV: RRR, no m/r/g  Lungs: breathing comfortable on RA w/o cough, some diminished lung sounds and occasional rhonci on R posterior auscultation  Abd: ND  Ext: no clubbing, no cyanosis  Skin: No rashes  Neuro: No focal deficits, alert & oriented    LABORATORY and RADIOLOGY DATA:   Pulmonary Function Test:   Last completed 02/27/24 clinically corresponding to resolving bronchiectasis flare. Spirometry remains wnl though FEF  may suggests mild midflow obstruction.       Date FVC FEV1 FEV1/FVC FEF25-75            02/27/24 1.94 (-1.10, 80%) 1.32 (-1.62, 70%) 68  0.7 (-1.69, 44%)    01/24/23 1.89 (-1.30,77%) 1.33 (-1.68, 69%) 70 0.8 (-1.51, 49%) 12.43    08/02/22 1.90 (72%) 1.31 (64%) 69         Pertinent Laboratory Data:            Pertinent Imaging Data:  CT Chest w/o Contrast 01/2023  *  Unchanged findings suggestive of chronic endobronchial infection, including nontuberculous mycobacterial etiologies like Mycobacterium avium complex. Component of chronic aspiration could also be present.  *  Debris in the thoracic esophagus, suggesting delayed esophageal clearance versus reflux.    2v CXR 07/2022  IMPRESSION:  Two small nodular density seen in lateral portion of left mid lung   which were present on CT scan of 2020 and most consistent with post   infectious scarring. Right perihilar and apical scarring is noted as   well. No definite acute abnormality is noted.     CT 04/2019:  IMPRESSION:   Interval improvement of patchy and nodular pulmonary parenchymal opacities with residual nodular opacities. Lingular bronchiectasis. Findings are most likely representing endobronchial infection such as MAI. Recommend 3-6 month follow-up chest CT for further evaluation.    CT 08/2019  IMPRESSION:  Persistent bronchiectasis, small airway inflammation and associated peripheral respiratory bronchiolitis, with mild increase in the bronchiolitis in the peripheral  right lower lobe. 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    CT 12/2019  IMPRESSION:   Mild lingular bronchiectasis, and clustered nodular pulmonary parenchymal opacities most likely representing indolent small airway infection. Overall the appearance is grossly unchanged from prior studies.    CT 03/2020  IMPRESSION:  Unchanged bronchiectasis within the lingula and medial right middle lobe as well as multiple clusters of centrilobular pulmonary nodules, which can be seen in the setting of indolent small airway infection.    CT 06/2021  IMPRESSION:  Redemonstrated mild bronchiectasis within the lingula and right middle lobe as multifocal areas of clustered and branching nodularity, some of which are new compared to prior. 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 Correlate sputum analysis as indicated.

## 2024-02-27 NOTE — Patient Instructions (Signed)
 It was a pleasure seeing you in clinic today! You were seen in clinic today by Dr. Mignon and me (Dr. Sebastian).    Based on review of your tests and symptoms, we agreed to the following treatment plan:  1) Let me know if you run into complications during your recovery though I am optimistic you will not  2) Renewed standing lab orders for if/when you produce sputum  3) [facility] script to [facility] for 3% HTS and albuterol  nebs. Remember to call for refills.They can be reached at [phone].   4) You are due for an RSV vaccine. You can always get this in clinic or at the downstairs pharmacy at a later time. You can also get at local pharmacies. FYI you are also due for tetanus vaccine; these are due every 10 years.  5) If you are interested in additional CF testing, let me know and I can place the order.   [phone] to request scheduling.     Follow up with me in 6 months!    Maurilio JAYSON Sebastian, MD  Pulmonary & Critical Care Fellow  [facility]  Phone: [phone]  Fax: [phone]  ______________________________________________________________________________________________________________________________  Additional Things You Should Know  A. Communication Between Visits:  The best way to reach me for non-urgent matters is through MyChart. If you do not use MyChart, call the clinic at [phone] and request to leave a message for me. I can usually respond within 3 business days, but please be patient as I often have other duties such as inpatient hospital work that make me unavailable. If you have sent a MyChart message to the clinic and have not received a response after three business days, please call our clinic to speak to a nurse.   If you need to be within days for new or worsening symptoms, contact your primary care provider or go to your local urgent care. You can also schedule an appointment with the Same Day Clinic here at [facility] [phone]). This clinic is staffed by our internal medicine colleagues and is available to you as a [facility] Pulmonary patient.  In the case of severe and/or rapidly progressive symptoms (shortness of breath, chest pain, sustained lightheadedness, loss of consciousness, high fevers, etc), call 911 or seek emergency care at your closest hospital.  For urgent lung issues after hours, you can call the hospital operator [phone]) and ask for the Pulmonary Fellow On Call. This doctor can provide some guidance and will send a message to your regular lung doctor the next morning.    B. Medication Refills:   You can request a refill, use your [facility] (patient portal), call the clinic, or have your pharmacy fax the request to [phone].  The [facility] can mail medications to your home. However, you must call to initiate this service and call back every month to request refills. They can be reached at [phone].   To contact the [facility], call [phone] or visit them on the first floor of this building [place]).    C. MyChart Access:  Download the MyChart app from the [employer] or [employer] and sign-up in the app   Sign-up online at [email or web address]

## 2024-03-05 ENCOUNTER — Other Ambulatory Visit: Payer: Self-pay | Admitting: Internal Medicine

## 2024-03-05 DIAGNOSIS — Z1231 Encounter for screening mammogram for malignant neoplasm of breast: Secondary | ICD-10-CM

## 2024-07-03 ENCOUNTER — Ambulatory Visit
